# Patient Record
Sex: Female | Born: 1970 | Race: Black or African American | Hispanic: No | Marital: Single | Smoking: Never smoker
Health system: Southern US, Community
[De-identification: ages and names within clinical notes are randomized; demographics above are authoritative.]

## PROBLEM LIST (undated history)

## (undated) DIAGNOSIS — E079 Disorder of thyroid, unspecified: Secondary | ICD-10-CM

## (undated) DIAGNOSIS — R51 Headache: Secondary | ICD-10-CM

## (undated) DIAGNOSIS — E0500 Thyrotoxicosis with diffuse goiter without thyrotoxic crisis or storm: Secondary | ICD-10-CM

## (undated) DIAGNOSIS — I10 Essential (primary) hypertension: Secondary | ICD-10-CM

## (undated) HISTORY — DX: Thyrotoxicosis with diffuse goiter without thyrotoxic crisis or storm: E05.00

## (undated) HISTORY — DX: Disorder of thyroid, unspecified: E07.9

## (undated) HISTORY — DX: Essential (primary) hypertension: I10

## (undated) HISTORY — PX: NO PAST SURGERIES: SHX2092

---

## 2000-02-21 ENCOUNTER — Other Ambulatory Visit: Admission: RE | Admit: 2000-02-21 | Discharge: 2000-02-21 | Payer: Self-pay | Admitting: Obstetrics & Gynecology

## 2001-08-04 ENCOUNTER — Emergency Department (HOSPITAL_COMMUNITY): Admission: EM | Admit: 2001-08-04 | Discharge: 2001-08-05 | Payer: Self-pay | Admitting: Emergency Medicine

## 2001-08-04 ENCOUNTER — Encounter: Payer: Self-pay | Admitting: Emergency Medicine

## 2005-12-16 ENCOUNTER — Other Ambulatory Visit: Admission: RE | Admit: 2005-12-16 | Discharge: 2005-12-16 | Payer: Self-pay | Admitting: Family Medicine

## 2008-11-07 ENCOUNTER — Ambulatory Visit (HOSPITAL_COMMUNITY): Admission: RE | Admit: 2008-11-07 | Discharge: 2008-11-07 | Payer: Self-pay | Admitting: Obstetrics and Gynecology

## 2008-12-04 ENCOUNTER — Encounter (HOSPITAL_COMMUNITY): Admission: RE | Admit: 2008-12-04 | Discharge: 2008-12-12 | Payer: Self-pay | Admitting: Endocrinology

## 2008-12-13 ENCOUNTER — Ambulatory Visit (HOSPITAL_COMMUNITY): Admission: RE | Admit: 2008-12-13 | Discharge: 2008-12-13 | Payer: Self-pay | Admitting: Endocrinology

## 2010-01-10 ENCOUNTER — Encounter: Admission: RE | Admit: 2010-01-10 | Discharge: 2010-04-10 | Payer: Self-pay | Admitting: Obstetrics and Gynecology

## 2011-09-19 LAB — HCG, SERUM, QUALITATIVE: Preg, Serum: NEGATIVE

## 2011-11-25 ENCOUNTER — Ambulatory Visit (INDEPENDENT_AMBULATORY_CARE_PROVIDER_SITE_OTHER): Payer: BC Managed Care – PPO

## 2011-11-25 DIAGNOSIS — R209 Unspecified disturbances of skin sensation: Secondary | ICD-10-CM

## 2011-11-25 DIAGNOSIS — L509 Urticaria, unspecified: Secondary | ICD-10-CM

## 2012-09-21 ENCOUNTER — Other Ambulatory Visit: Payer: Self-pay | Admitting: Radiology

## 2012-09-21 ENCOUNTER — Other Ambulatory Visit: Payer: Self-pay | Admitting: Physician Assistant

## 2012-09-21 MED ORDER — HYDROCHLOROTHIAZIDE 25 MG PO TABS: 25.0000 mg | ORAL_TABLET | Freq: Every day | ORAL | Status: DC

## 2012-12-20 ENCOUNTER — Other Ambulatory Visit: Payer: Self-pay | Admitting: Physician Assistant

## 2012-12-20 NOTE — Telephone Encounter (Signed)
Needs OV - 2nd notice 

## 2012-12-30 ENCOUNTER — Encounter: Payer: Self-pay | Admitting: Family Medicine

## 2012-12-30 ENCOUNTER — Other Ambulatory Visit: Payer: Self-pay | Admitting: Family Medicine

## 2012-12-30 ENCOUNTER — Ambulatory Visit (INDEPENDENT_AMBULATORY_CARE_PROVIDER_SITE_OTHER): Payer: BC Managed Care – PPO | Admitting: Family Medicine

## 2012-12-30 VITALS — BP 139/77 | HR 60 | Temp 98.1°F | Resp 16 | Ht 65.0 in | Wt 228.0 lb

## 2012-12-30 DIAGNOSIS — Z800 Family history of malignant neoplasm of digestive organs: Secondary | ICD-10-CM

## 2012-12-30 DIAGNOSIS — Z23 Encounter for immunization: Secondary | ICD-10-CM

## 2012-12-30 DIAGNOSIS — D219 Benign neoplasm of connective and other soft tissue, unspecified: Secondary | ICD-10-CM

## 2012-12-30 DIAGNOSIS — I10 Essential (primary) hypertension: Secondary | ICD-10-CM

## 2012-12-30 DIAGNOSIS — G4730 Sleep apnea, unspecified: Secondary | ICD-10-CM

## 2012-12-30 DIAGNOSIS — Z0000 Encounter for general adult medical examination without abnormal findings: Secondary | ICD-10-CM

## 2012-12-30 DIAGNOSIS — Z8639 Personal history of other endocrine, nutritional and metabolic disease: Secondary | ICD-10-CM

## 2012-12-30 DIAGNOSIS — D259 Leiomyoma of uterus, unspecified: Secondary | ICD-10-CM

## 2012-12-30 LAB — POCT URINALYSIS DIPSTICK
Bilirubin, UA: NEGATIVE
Blood, UA: NEGATIVE
Glucose, UA: NEGATIVE
Ketones, UA: NEGATIVE
Leukocytes, UA: NEGATIVE
Nitrite, UA: NEGATIVE
Protein, UA: NEGATIVE
Spec Grav, UA: 1.015
Urobilinogen, UA: 0.2
pH, UA: 7

## 2012-12-30 LAB — COMPREHENSIVE METABOLIC PANEL
ALT: 12 U/L (ref 0–35)
AST: 23 U/L (ref 0–37)
Albumin: 4.1 g/dL (ref 3.5–5.2)
Alkaline Phosphatase: 65 U/L (ref 39–117)
BUN: 16 mg/dL (ref 6–23)
CO2: 27 mEq/L (ref 19–32)
Calcium: 9.4 mg/dL (ref 8.4–10.5)
Chloride: 100 mEq/L (ref 96–112)
Creat: 0.64 mg/dL (ref 0.50–1.10)
Glucose, Bld: 85 mg/dL (ref 70–99)
Potassium: 3.9 mEq/L (ref 3.5–5.3)
Sodium: 137 mEq/L (ref 135–145)
Total Bilirubin: 0.7 mg/dL (ref 0.3–1.2)
Total Protein: 6.9 g/dL (ref 6.0–8.3)

## 2012-12-30 LAB — CBC
HCT: 35.5 % — ABNORMAL LOW (ref 36.0–46.0)
Hemoglobin: 11.4 g/dL — ABNORMAL LOW (ref 12.0–15.0)
MCH: 23.5 pg — ABNORMAL LOW (ref 26.0–34.0)
MCHC: 32.1 g/dL (ref 30.0–36.0)
MCV: 73.2 fL — ABNORMAL LOW (ref 78.0–100.0)
Platelets: 330 10*3/uL (ref 150–400)
RBC: 4.85 MIL/uL (ref 3.87–5.11)
RDW: 18 % — ABNORMAL HIGH (ref 11.5–15.5)
WBC: 4.4 10*3/uL (ref 4.0–10.5)

## 2012-12-30 LAB — LIPID PANEL
Cholesterol: 190 mg/dL (ref 0–200)
HDL: 55 mg/dL (ref >39)
LDL Cholesterol: 122 mg/dL — ABNORMAL HIGH (ref 0–99)
Total CHOL/HDL Ratio: 3.5 Ratio
Triglycerides: 63 mg/dL (ref <150)
VLDL: 13 mg/dL (ref 0–40)

## 2012-12-30 LAB — TSH: TSH: 3.609 u[IU]/mL (ref 0.350–4.500)

## 2012-12-30 LAB — T4, FREE: Free T4: 1.32 ng/dL (ref 0.80–1.80)

## 2012-12-30 MED ORDER — HYDROCHLOROTHIAZIDE 25 MG PO TABS: 12.5000 mg | ORAL_TABLET | Freq: Every day | ORAL | Status: DC

## 2012-12-30 MED ORDER — LEVOTHYROXINE SODIUM 25 MCG PO TABS: 25.0000 ug | ORAL_TABLET | Freq: Every day | ORAL | Status: DC

## 2012-12-30 NOTE — Patient Instructions (Addendum)

## 2012-12-30 NOTE — Progress Notes (Signed)
  Subjective:    Patient ID: Heidi Soto, female    DOB: Dec 12, 1971, 42 y.o.   MRN: [id number]  HPI is a 42 year old file and asked who comes in for complete physical. The 2 concerns she has R. or her family history of colon cancer and sleep disturbance  Her father had colon cancer at an early age. Patient is having no constipation or blood per rectum. Patient has been observed to have periods of snoring and interruption of breathing during sleep observed by a friend. She has some fatigue. Patient also notes that she has had a problem with uterine fibroids: She can feel them in her abdomen, she has some painful periods, and she has some extra heavy bleeding at times. Her gynecologist is Dr. Adalberto Ill and she will be making an appointment for her annual exam 7    Review of Systems  Constitutional: Negative.   HENT: Negative.   Eyes: Negative.   Respiratory: Negative.   Cardiovascular: Negative.   Gastrointestinal: Positive for constipation, abdominal distention and rectal pain.  Genitourinary: Negative.   Musculoskeletal: Positive for arthralgias and gait problem.  Skin: Negative.   Neurological: Positive for light-headedness.  Hematological: Negative.   Psychiatric/Behavioral: Positive for sleep disturbance.       Objective:   Physical Exam no acute distress-see vital signs section for vital signs HEENT: Unremarkable Neck: Supple no adenopathy Heart regular no murmur Chest: Clear Abdomen: Palpable large uterine fibroids and the entire lower abdomen up to the umbilicus. Extremities: Full range of motion Skin: No rashes or unusual lesions      Assessment & Plan:   1. Annual physical exam  CBC, Comprehensive metabolic panel, Lipid panel, POCT urinalysis dipstick  2. H/O Graves' disease  TSH, T4, Free, levothyroxine (SYNTHROID, LEVOTHROID) 25 MCG tablet  3. Fibroids    4. Sleep apnea  Ambulatory referral to Sleep Studies  5. FH: colon cancer  Ambulatory referral to  Gastroenterology  6. Hypertension  hydrochlorothiazide (HYDRODIURIL) 25 MG tablet  7. Need for prophylactic vaccination and inoculation against influenza  Flu vaccine greater than or equal to 3yo preservative free IM

## 2013-03-02 ENCOUNTER — Encounter: Payer: Self-pay | Admitting: Family Medicine

## 2013-03-02 ENCOUNTER — Ambulatory Visit (INDEPENDENT_AMBULATORY_CARE_PROVIDER_SITE_OTHER): Payer: BC Managed Care – PPO | Admitting: Family Medicine

## 2013-03-02 VITALS — BP 146/93 | HR 87 | Temp 98.3°F | Resp 16 | Ht 65.0 in | Wt 245.0 lb

## 2013-03-02 DIAGNOSIS — E039 Hypothyroidism, unspecified: Secondary | ICD-10-CM

## 2013-03-02 DIAGNOSIS — D509 Iron deficiency anemia, unspecified: Secondary | ICD-10-CM

## 2013-03-02 DIAGNOSIS — D259 Leiomyoma of uterus, unspecified: Secondary | ICD-10-CM

## 2013-03-02 LAB — CBC
HCT: 36.6 % (ref 36.0–46.0)
Hemoglobin: 11.8 g/dL — ABNORMAL LOW (ref 12.0–15.0)
MCH: 24 pg — ABNORMAL LOW (ref 26.0–34.0)
MCHC: 32.2 g/dL (ref 30.0–36.0)
MCV: 74.4 fL — ABNORMAL LOW (ref 78.0–100.0)
Platelets: 295 10*3/uL (ref 150–400)
RBC: 4.92 MIL/uL (ref 3.87–5.11)
RDW: 19.4 % — ABNORMAL HIGH (ref 11.5–15.5)
WBC: 7.1 10*3/uL (ref 4.0–10.5)

## 2013-03-02 LAB — TSH: TSH: 16.08 u[IU]/mL — ABNORMAL HIGH (ref 0.350–4.500)

## 2013-03-02 LAB — FERRITIN: Ferritin: 5 ng/mL — ABNORMAL LOW (ref 10–291)

## 2013-03-02 NOTE — Progress Notes (Signed)
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  She has constipation and eructation.  She is having colonoscopy next week. She sees Dr. Adalberto Ill. She was taking BCP throughout the month of February but she bled the entire month. Gaining weight and exercise is uncomfortable.  Objective:  NAD Abdomen:  20 week size uterus with irreg lower abdominal mass  Assessment:  Marked fibroids with GI side effects, bleeding and anemia.  I believe this woman needs a procedure to reduce the fibroids, and the question is whether hysterectomy or myomectomy after Lupron is more appropriated.  Plan:  Second Gyn opinion Uterine fibroid - Plan: TSH, CBC, Ferritin, Ambulatory referral to Gynecology  Unspecified hypothyroidism - Plan: TSH

## 2013-03-03 ENCOUNTER — Other Ambulatory Visit: Payer: Self-pay | Admitting: Family Medicine

## 2013-03-03 DIAGNOSIS — E039 Hypothyroidism, unspecified: Secondary | ICD-10-CM

## 2013-03-03 MED ORDER — LEVOTHYROXINE SODIUM 88 MCG PO TABS: 88.0000 ug | ORAL_TABLET | Freq: Every day | ORAL | Status: DC

## 2013-06-20 ENCOUNTER — Other Ambulatory Visit: Payer: Self-pay | Admitting: Obstetrics & Gynecology

## 2013-06-21 ENCOUNTER — Encounter (HOSPITAL_COMMUNITY): Payer: Self-pay | Admitting: Pharmacy Technician

## 2013-06-23 ENCOUNTER — Other Ambulatory Visit: Payer: Self-pay

## 2013-06-23 ENCOUNTER — Encounter (HOSPITAL_COMMUNITY)
Admission: RE | Admit: 2013-06-23 | Discharge: 2013-06-23 | Disposition: A | Discharge status: Home / Self Care | Payer: BC Managed Care – PPO | Source: Ambulatory Visit | Attending: Obstetrics & Gynecology | Admitting: Obstetrics & Gynecology

## 2013-06-23 ENCOUNTER — Encounter (HOSPITAL_COMMUNITY): Payer: Self-pay

## 2013-06-23 DIAGNOSIS — Z01818 Encounter for other preprocedural examination: Secondary | ICD-10-CM | POA: Insufficient documentation

## 2013-06-23 DIAGNOSIS — Z01812 Encounter for preprocedural laboratory examination: Secondary | ICD-10-CM | POA: Insufficient documentation

## 2013-06-23 HISTORY — DX: Headache: R51

## 2013-06-23 LAB — CBC
Platelets: 262 10*3/uL (ref 150–400)
RDW: 16.9 % — ABNORMAL HIGH (ref 11.5–15.5)
WBC: 6 10*3/uL (ref 4.0–10.5)

## 2013-06-23 LAB — BASIC METABOLIC PANEL
Chloride: 98 mEq/L (ref 96–112)
GFR calc Af Amer: >90 mL/min (ref >90)
GFR calc non Af Amer: >90 mL/min (ref >90)
Potassium: 3.3 mEq/L — ABNORMAL LOW (ref 3.5–5.1)
Sodium: 136 mEq/L (ref 135–145)

## 2013-06-23 NOTE — Patient Instructions (Signed)
Your procedure is scheduled on:06/29/13  Enter through the Main Entrance at :1130 am Pick up desk phone and dial [phone] and inform us of your arrival.  Please call [phone] if you have any problems the morning of surgery.  Remember: Do not eat food after midnight: Tuesday Clear liquids are ok until:9am on Wed   You may brush your teeth the morning of surgery.  Take these meds the morning of surgery with a sip of water:Thyroid pill and BP pill  DO NOT wear jewelry, eye make-up, lipstick,body lotion, or dark fingernail polish.  (Polished toes are ok) You may wear deodorant.  If you are to be admitted after surgery, leave suitcase in car until your room has been assigned. Patients discharged on the day of surgery will not be allowed to drive home. Wear loose fitting, comfortable clothes for your ride home.

## 2013-06-27 NOTE — Pre-Procedure Instructions (Signed)
EKG rec'd from [facility]. Reviewed by Dr Arby Barrette. Ok'd for surgery, no orders given.

## 2013-06-29 ENCOUNTER — Encounter (HOSPITAL_COMMUNITY): Payer: Self-pay | Admitting: Anesthesiology

## 2013-06-29 ENCOUNTER — Encounter (HOSPITAL_COMMUNITY)
Admission: RE | Disposition: A | Discharge status: Home / Self Care | Payer: Self-pay | Source: Ambulatory Visit | Attending: Obstetrics & Gynecology

## 2013-06-29 ENCOUNTER — Inpatient Hospital Stay (HOSPITAL_COMMUNITY)
Admission: RE | Admit: 2013-06-29 | Discharge: 2013-07-01 | DRG: 359 | Disposition: A | Discharge status: Home / Self Care | Payer: BC Managed Care – PPO | Source: Ambulatory Visit | Attending: Obstetrics & Gynecology | Admitting: Obstetrics & Gynecology

## 2013-06-29 ENCOUNTER — Encounter (HOSPITAL_COMMUNITY): Payer: Self-pay | Admitting: *Deleted

## 2013-06-29 ENCOUNTER — Inpatient Hospital Stay (HOSPITAL_COMMUNITY): Payer: BC Managed Care – PPO | Admitting: Anesthesiology

## 2013-06-29 DIAGNOSIS — Z90710 Acquired absence of both cervix and uterus: Secondary | ICD-10-CM

## 2013-06-29 DIAGNOSIS — E079 Disorder of thyroid, unspecified: Secondary | ICD-10-CM | POA: Diagnosis present

## 2013-06-29 DIAGNOSIS — N72 Inflammatory disease of cervix uteri: Secondary | ICD-10-CM | POA: Diagnosis present

## 2013-06-29 DIAGNOSIS — D649 Anemia, unspecified: Secondary | ICD-10-CM | POA: Diagnosis present

## 2013-06-29 DIAGNOSIS — N8000 Endometriosis of the uterus, unspecified: Secondary | ICD-10-CM | POA: Diagnosis present

## 2013-06-29 DIAGNOSIS — D219 Benign neoplasm of connective and other soft tissue, unspecified: Secondary | ICD-10-CM

## 2013-06-29 DIAGNOSIS — I10 Essential (primary) hypertension: Secondary | ICD-10-CM | POA: Diagnosis present

## 2013-06-29 DIAGNOSIS — D250 Submucous leiomyoma of uterus: Principal | ICD-10-CM | POA: Diagnosis present

## 2013-06-29 DIAGNOSIS — Z8639 Personal history of other endocrine, nutritional and metabolic disease: Secondary | ICD-10-CM

## 2013-06-29 DIAGNOSIS — D251 Intramural leiomyoma of uterus: Secondary | ICD-10-CM | POA: Diagnosis present

## 2013-06-29 DIAGNOSIS — E0500 Thyrotoxicosis with diffuse goiter without thyrotoxic crisis or storm: Secondary | ICD-10-CM | POA: Diagnosis present

## 2013-06-29 DIAGNOSIS — N920 Excessive and frequent menstruation with regular cycle: Secondary | ICD-10-CM | POA: Diagnosis present

## 2013-06-29 DIAGNOSIS — D252 Subserosal leiomyoma of uterus: Secondary | ICD-10-CM | POA: Diagnosis present

## 2013-06-29 DIAGNOSIS — N840 Polyp of corpus uteri: Secondary | ICD-10-CM | POA: Diagnosis present

## 2013-06-29 HISTORY — PX: BILATERAL SALPINGECTOMY: SHX5743

## 2013-06-29 HISTORY — PX: ABDOMINAL HYSTERECTOMY: SHX81

## 2013-06-29 LAB — BASIC METABOLIC PANEL
BUN: 10 mg/dL (ref 6–23)
CO2: 27 mEq/L (ref 19–32)
Calcium: 9.8 mg/dL (ref 8.4–10.5)
GFR calc Af Amer: >90 mL/min (ref >90)
GFR calc non Af Amer: >90 mL/min (ref >90)
GFR calc non Af Amer: >90 mL/min (ref >90)
Glucose, Bld: 136 mg/dL — ABNORMAL HIGH (ref 70–99)
Potassium: 3.5 mEq/L (ref 3.5–5.1)
Sodium: 139 mEq/L (ref 135–145)

## 2013-06-29 LAB — CBC
HCT: 37.5 % (ref 36.0–46.0)
Hemoglobin: 12.5 g/dL (ref 12.0–15.0)
MCHC: 33.3 g/dL (ref 30.0–36.0)

## 2013-06-29 LAB — TYPE AND SCREEN: ABO/RH(D): O POS

## 2013-06-29 LAB — PREGNANCY, URINE: Preg Test, Ur: NEGATIVE

## 2013-06-29 SURGERY — HYSTERECTOMY, ABDOMINAL
Anesthesia: General | Site: Abdomen | Wound class: Clean Contaminated

## 2013-06-29 MED ORDER — EPHEDRINE 5 MG/ML INJ
INTRAVENOUS | Status: AC
  Filled 2013-06-29: qty 10

## 2013-06-29 MED ORDER — MORPHINE SULFATE (PF) 1 MG/ML IV SOLN
INTRAVENOUS | Status: AC
  Administered 2013-06-29: 21 mg via INTRAVENOUS
  Administered 2013-06-29 (×2): via INTRAVENOUS
  Administered 2013-06-30: 14 mg via INTRAVENOUS
  Administered 2013-06-30: 15 mg via INTRAVENOUS
  Administered 2013-06-30: 07:00:00 via INTRAVENOUS
  Filled 2013-06-29 (×3): qty 25

## 2013-06-29 MED ORDER — ONDANSETRON HCL 4 MG/2ML IJ SOLN
INTRAMUSCULAR | Status: DC | PRN
  Administered 2013-06-29: 4 mg via INTRAVENOUS

## 2013-06-29 MED ORDER — LACTATED RINGERS IV SOLN
INTRAVENOUS | Status: DC
  Administered 2013-06-29 (×5): via INTRAVENOUS

## 2013-06-29 MED ORDER — KETOROLAC TROMETHAMINE 30 MG/ML IJ SOLN: 30.0000 mg | Freq: Three times a day (TID) | INTRAMUSCULAR | Status: DC

## 2013-06-29 MED ORDER — BUPIVACAINE HCL (PF) 0.25 % IJ SOLN
INTRAMUSCULAR | Status: DC | PRN
  Administered 2013-06-29: 10 mL

## 2013-06-29 MED ORDER — GLYCOPYRROLATE 0.2 MG/ML IJ SOLN
INTRAMUSCULAR | Status: DC | PRN
  Administered 2013-06-29: 0.6 mg via INTRAVENOUS
  Administered 2013-06-29 (×2): 0.1 mg via INTRAVENOUS

## 2013-06-29 MED ORDER — ACETAMINOPHEN 10 MG/ML IV SOLN
INTRAVENOUS | Status: AC
  Administered 2013-06-29: 1000 mg via INTRAVENOUS
  Filled 2013-06-29: qty 100

## 2013-06-29 MED ORDER — HYDROMORPHONE HCL PF 1 MG/ML IJ SOLN
INTRAMUSCULAR | Status: AC
  Administered 2013-06-29: 0.5 mg via INTRAVENOUS
  Filled 2013-06-29: qty 1

## 2013-06-29 MED ORDER — KETOROLAC TROMETHAMINE 30 MG/ML IJ SOLN: 15.0000 mg | Freq: Once | INTRAMUSCULAR | Status: DC | PRN

## 2013-06-29 MED ORDER — FENTANYL CITRATE 0.05 MG/ML IJ SOLN
INTRAMUSCULAR | Status: DC | PRN
  Administered 2013-06-29: 50 ug via INTRAVENOUS
  Administered 2013-06-29 (×2): 100 ug via INTRAVENOUS
  Administered 2013-06-29 (×2): 50 ug via INTRAVENOUS

## 2013-06-29 MED ORDER — ROCURONIUM BROMIDE 50 MG/5ML IV SOLN
INTRAVENOUS | Status: AC
  Filled 2013-06-29: qty 1

## 2013-06-29 MED ORDER — MIDAZOLAM HCL 2 MG/2ML IJ SOLN
INTRAMUSCULAR | Status: AC
  Filled 2013-06-29: qty 2

## 2013-06-29 MED ORDER — HYDROCHLOROTHIAZIDE 25 MG PO TABS
12.5000 mg | ORAL_TABLET | Freq: Every day | ORAL | Status: DC
  Filled 2013-06-29: qty 0.5

## 2013-06-29 MED ORDER — FENTANYL CITRATE 0.05 MG/ML IJ SOLN
INTRAMUSCULAR | Status: AC
  Filled 2013-06-29: qty 10

## 2013-06-29 MED ORDER — DIPHENHYDRAMINE HCL 12.5 MG/5ML PO ELIX
12.5000 mg | ORAL_SOLUTION | Freq: Four times a day (QID) | ORAL | Status: DC | PRN
  Administered 2013-06-30: 12.5 mg via ORAL
  Filled 2013-06-29: qty 5

## 2013-06-29 MED ORDER — SODIUM CHLORIDE 0.9 % IJ SOLN: 9.0000 mL | INTRAMUSCULAR | Status: DC | PRN

## 2013-06-29 MED ORDER — PROPOFOL 10 MG/ML IV EMUL
INTRAVENOUS | Status: AC
  Filled 2013-06-29: qty 20

## 2013-06-29 MED ORDER — DEXAMETHASONE SODIUM PHOSPHATE 10 MG/ML IJ SOLN
INTRAMUSCULAR | Status: AC
  Filled 2013-06-29: qty 1

## 2013-06-29 MED ORDER — ONDANSETRON HCL 4 MG/2ML IJ SOLN
INTRAMUSCULAR | Status: AC
  Filled 2013-06-29: qty 2

## 2013-06-29 MED ORDER — MENTHOL 3 MG MT LOZG: 1.0000 | LOZENGE | OROMUCOSAL | Status: DC | PRN

## 2013-06-29 MED ORDER — HYDROCHLOROTHIAZIDE 12.5 MG PO CAPS
12.5000 mg | ORAL_CAPSULE | Freq: Every day | ORAL | Status: DC
  Administered 2013-06-30 – 2013-07-01 (×2): 12.5 mg via ORAL
  Filled 2013-06-29 (×3): qty 1

## 2013-06-29 MED ORDER — CEFAZOLIN SODIUM-DEXTROSE 2-3 GM-% IV SOLR
2.0000 g | INTRAVENOUS | Status: AC
  Administered 2013-06-29: 2 g via INTRAVENOUS

## 2013-06-29 MED ORDER — GLYCOPYRROLATE 0.2 MG/ML IJ SOLN
INTRAMUSCULAR | Status: AC
  Filled 2013-06-29: qty 3

## 2013-06-29 MED ORDER — ONDANSETRON HCL 4 MG/2ML IJ SOLN
4.0000 mg | Freq: Four times a day (QID) | INTRAMUSCULAR | Status: DC | PRN
  Administered 2013-06-30: 4 mg via INTRAVENOUS
  Filled 2013-06-29 (×2): qty 2

## 2013-06-29 MED ORDER — PHENYLEPHRINE HCL 10 MG/ML IJ SOLN
INTRAMUSCULAR | Status: DC | PRN
  Administered 2013-06-29 (×2): 40 ug via INTRAVENOUS

## 2013-06-29 MED ORDER — LEVOTHYROXINE SODIUM 88 MCG PO TABS
88.0000 ug | ORAL_TABLET | ORAL | Status: DC
  Administered 2013-06-30 – 2013-07-01 (×2): 88 ug via ORAL
  Filled 2013-06-29 (×3): qty 1

## 2013-06-29 MED ORDER — 0.9 % SODIUM CHLORIDE (POUR BTL) OPTIME
TOPICAL | Status: DC | PRN
  Administered 2013-06-29 (×2): 1000 mL

## 2013-06-29 MED ORDER — ONDANSETRON HCL 4 MG/2ML IJ SOLN
4.0000 mg | Freq: Four times a day (QID) | INTRAMUSCULAR | Status: DC | PRN
  Administered 2013-06-29: 4 mg via INTRAVENOUS

## 2013-06-29 MED ORDER — PROPOFOL 10 MG/ML IV BOLUS
INTRAVENOUS | Status: DC | PRN
  Administered 2013-06-29: 200 mg via INTRAVENOUS

## 2013-06-29 MED ORDER — ONDANSETRON HCL 4 MG PO TABS: 4.0000 mg | ORAL_TABLET | Freq: Four times a day (QID) | ORAL | Status: DC | PRN

## 2013-06-29 MED ORDER — NALOXONE HCL 0.4 MG/ML IJ SOLN: 0.4000 mg | INTRAMUSCULAR | Status: DC | PRN

## 2013-06-29 MED ORDER — OXYCODONE-ACETAMINOPHEN 5-325 MG PO TABS
1.0000 | ORAL_TABLET | ORAL | Status: DC | PRN
  Administered 2013-06-30 – 2013-07-01 (×7): 2 via ORAL
  Filled 2013-06-29 (×7): qty 2

## 2013-06-29 MED ORDER — DIPHENHYDRAMINE HCL 50 MG/ML IJ SOLN: 12.5000 mg | Freq: Four times a day (QID) | INTRAMUSCULAR | Status: DC | PRN

## 2013-06-29 MED ORDER — KETOROLAC TROMETHAMINE 30 MG/ML IJ SOLN
INTRAMUSCULAR | Status: DC | PRN
  Administered 2013-06-29: 30 mg via INTRAVENOUS

## 2013-06-29 MED ORDER — LACTATED RINGERS IV SOLN: INTRAVENOUS | Status: DC

## 2013-06-29 MED ORDER — EPHEDRINE SULFATE 50 MG/ML IJ SOLN
INTRAMUSCULAR | Status: DC | PRN
  Administered 2013-06-29 (×2): 5 mg via INTRAVENOUS

## 2013-06-29 MED ORDER — POTASSIUM CHLORIDE 2 MEQ/ML IV SOLN
INTRAVENOUS | Status: DC
  Administered 2013-06-29 – 2013-06-30 (×2): via INTRAVENOUS
  Filled 2013-06-29 (×8): qty 1000

## 2013-06-29 MED ORDER — NEOSTIGMINE METHYLSULFATE 1 MG/ML IJ SOLN
INTRAMUSCULAR | Status: DC | PRN
  Administered 2013-06-29: 3 mg via INTRAVENOUS

## 2013-06-29 MED ORDER — HYDROMORPHONE HCL PF 1 MG/ML IJ SOLN
0.2500 mg | INTRAMUSCULAR | Status: DC | PRN
  Administered 2013-06-29 (×2): 0.5 mg via INTRAVENOUS

## 2013-06-29 MED ORDER — MIDAZOLAM HCL 5 MG/5ML IJ SOLN
INTRAMUSCULAR | Status: DC | PRN
  Administered 2013-06-29: 2 mg via INTRAVENOUS

## 2013-06-29 MED ORDER — DEXAMETHASONE SODIUM PHOSPHATE 4 MG/ML IJ SOLN
INTRAMUSCULAR | Status: DC | PRN
  Administered 2013-06-29: 8 mg via INTRAVENOUS

## 2013-06-29 MED ORDER — ROCURONIUM BROMIDE 100 MG/10ML IV SOLN
INTRAVENOUS | Status: DC | PRN
  Administered 2013-06-29 (×3): 10 mg via INTRAVENOUS
  Administered 2013-06-29: 40 mg via INTRAVENOUS
  Administered 2013-06-29 (×2): 5 mg via INTRAVENOUS

## 2013-06-29 MED ORDER — PROMETHAZINE HCL 25 MG/ML IJ SOLN: 6.2500 mg | INTRAMUSCULAR | Status: DC | PRN

## 2013-06-29 MED ORDER — MEPERIDINE HCL 25 MG/ML IJ SOLN: 6.2500 mg | INTRAMUSCULAR | Status: DC | PRN

## 2013-06-29 MED ORDER — NEOSTIGMINE METHYLSULFATE 1 MG/ML IJ SOLN
INTRAMUSCULAR | Status: AC
  Filled 2013-06-29: qty 1

## 2013-06-29 MED ORDER — LIDOCAINE HCL (CARDIAC) 20 MG/ML IV SOLN
INTRAVENOUS | Status: DC | PRN
  Administered 2013-06-29: 50 mg via INTRAVENOUS
  Administered 2013-06-29: 20 mg via INTRAVENOUS

## 2013-06-29 MED ORDER — CEFAZOLIN SODIUM-DEXTROSE 2-3 GM-% IV SOLR
INTRAVENOUS | Status: AC
  Filled 2013-06-29: qty 50

## 2013-06-29 MED ORDER — FENTANYL CITRATE 0.05 MG/ML IJ SOLN
INTRAMUSCULAR | Status: AC
  Filled 2013-06-29: qty 5

## 2013-06-29 MED ORDER — ACETAMINOPHEN 10 MG/ML IV SOLN: 1000.0000 mg | Freq: Once | INTRAVENOUS | Status: DC

## 2013-06-29 MED ORDER — BUPIVACAINE HCL (PF) 0.25 % IJ SOLN
INTRAMUSCULAR | Status: AC
  Filled 2013-06-29: qty 30

## 2013-06-29 SURGICAL SUPPLY — 27 items
CANISTER SUCTION 2500CC (MISCELLANEOUS) ×3 IMPLANT
CHLORAPREP W/TINT 26ML (MISCELLANEOUS) ×1 IMPLANT
CLOTH BEACON ORANGE TIMEOUT ST (SAFETY) ×3 IMPLANT
CONT PATH 16OZ SNAP LID 3702 (MISCELLANEOUS) ×3 IMPLANT
DRSG OPSITE POSTOP 4X10 (GAUZE/BANDAGES/DRESSINGS) ×2 IMPLANT
GAUZE SPONGE 4X4 16PLY XRAY LF (GAUZE/BANDAGES/DRESSINGS) ×3 IMPLANT
GLOVE BIO SURGEON STRL SZ7 (GLOVE) ×5 IMPLANT
GLOVE BIOGEL PI IND STRL 7.0 (GLOVE) ×2 IMPLANT
GLOVE BIOGEL PI INDICATOR 7.0 (GLOVE) ×2
GOWN PREVENTION PLUS LG XLONG (DISPOSABLE) ×9 IMPLANT
NS IRRIG 1000ML POUR BTL (IV SOLUTION) ×4 IMPLANT
PACK ABDOMINAL GYN (CUSTOM PROCEDURE TRAY) ×3 IMPLANT
PAD OB MATERNITY 4.3X12.25 (PERSONAL CARE ITEMS) ×3 IMPLANT
PROTECTOR NERVE ULNAR (MISCELLANEOUS) ×3 IMPLANT
SPONGE LAP 18X18 X RAY DECT (DISPOSABLE) ×3 IMPLANT
STAPLER VISISTAT 35W (STAPLE) IMPLANT
SUT PLAIN 2 0 XLH (SUTURE) IMPLANT
SUT PROLENE 0 CT 1 30 (SUTURE) IMPLANT
SUT VIC AB 0 CT1 18XCR BRD8 (SUTURE) ×6 IMPLANT
SUT VIC AB 0 CT1 36 (SUTURE) ×16 IMPLANT
SUT VIC AB 0 CT1 8-18 (SUTURE) ×9
SUT VIC AB 4-0 KS 27 (SUTURE) ×3 IMPLANT
SUT VIC AB 4-0 SH 18 (SUTURE) IMPLANT
SUT VICRYL 0 TIES 12 18 (SUTURE) ×3 IMPLANT
TOWEL OR 17X24 6PK STRL BLUE (TOWEL DISPOSABLE) ×6 IMPLANT
TRAY FOLEY CATH 14FR (SET/KITS/TRAYS/PACK) ×3 IMPLANT
WATER STERILE IRR 1000ML POUR (IV SOLUTION) ×3 IMPLANT

## 2013-06-29 NOTE — Anesthesia Preprocedure Evaluation (Signed)
Anesthesia Evaluation  Patient identified by MRN, date of birth, ID band Patient awake    Reviewed: Allergy & Precautions, H&P , NPO status , Patient's Chart, lab work & pertinent test results  Airway Mallampati: I TM Distance: >3 FB Neck ROM: full    Dental no notable dental hx. (+) Teeth Intact   Pulmonary neg pulmonary ROS,    Pulmonary exam normal       Cardiovascular hypertension, Pt. on medications     Neuro/Psych negative psych ROS   GI/Hepatic negative GI ROS, Neg liver ROS,   Endo/Other  Hyperthyroidism Morbid obesity  Renal/GU negative Renal ROS     Musculoskeletal negative musculoskeletal ROS (+)   Abdominal (+) + obese,   Peds negative pediatric ROS (+)  Hematology negative hematology ROS (+)   Anesthesia Other Findings   Reproductive/Obstetrics negative OB ROS                           Anesthesia Physical Anesthesia Plan  ASA: III  Anesthesia Plan: General   Post-op Pain Management:    Induction: Intravenous  Airway Management Planned: Oral ETT  Additional Equipment:   Intra-op Plan:   Post-operative Plan:   Informed Consent: I have reviewed the patients History and Physical, chart, labs and discussed the procedure including the risks, benefits and alternatives for the proposed anesthesia with the patient or authorized representative who has indicated his/her understanding and acceptance.   Dental Advisory Given  Plan Discussed with: CRNA and Surgeon  Anesthesia Plan Comments:         Anesthesia Quick Evaluation

## 2013-06-29 NOTE — Anesthesia Postprocedure Evaluation (Signed)
  Anesthesia Post Note  Patient: Heidi Soto  Procedure(s) Performed: Procedure(s) (LRB): HYSTERECTOMY ABDOMINAL (N/A) BILATERAL SALPINGECTOMY (Bilateral)  Anesthesia type: GA  Patient location: PACU  Post pain: Pain level controlled  Post assessment: Post-op Vital signs reviewed  Last Vitals:  Filed Vitals:   06/29/13 1620  BP:   Pulse:   Temp: 36.6 C  Resp:     Post vital signs: Reviewed  Level of consciousness: sedated  Complications: No apparent anesthesia complications

## 2013-06-29 NOTE — H&P (Signed)
Heidi Soto is an 41 y.o. female G0 with symptomatic uterine fibroids, menorrhagia, anemia, abdominal mass. She desires hysterectomy and declined morcellation of fibroids. She also declined myomectomy as well as uterine fibroid embolization.  She is s/p Depo Lupron inj to reduce fibroids size. Denies any bleeding/menses in 2.1/2 months. Nl Pap history. No breast complaints. Is single at present and declines future childbearing option.  Patient's last menstrual period was 04/13/2013.    Past Medical History  Diagnosis Date  . Thyroid disease   . Graves disease   . Hypertension   . [id number](657.8)     Past Surgical History  Procedure Laterality Date  . No past surgeries      Family History  Problem Relation Age of Onset  . COPD Mother   . Hypertension Mother   . Asthma Mother   . Hypertension Father   . Diabetes Father   . Cancer Father     colon and prostate  . Hypertension Sister   . Diabetes Sister   . Diabetes Brother   . Hypertension Brother   . Heart disease Paternal Grandmother   . Heart disease Paternal Grandfather     Social History:  reports that she has never smoked. She does not have any smokeless tobacco history on file. She reports that she does not drink alcohol or use illicit drugs.  Allergies:  Allergies  Allergen Reactions  . Bactroban (Mupirocin Calcium) Rash    Prescriptions prior to admission  Medication Sig Dispense Refill  . hydrochlorothiazide (HYDRODIURIL) 25 MG tablet Take 0.5 tablets (12.5 mg total) by mouth daily. NEED OFFICE VISIT  90 tablet  3  . levothyroxine (SYNTHROID, LEVOTHROID) 88 MCG tablet Take 1 tablet (88 mcg total) by mouth daily.  90 tablet  3  . Multiple Vitamin (MULTIVITAMIN WITH MINERALS) TABS Take 1 tablet by mouth daily.      . hydrocortisone cream 1 % Apply 1 application topically 2 (two) times daily as needed (skin rash).        Review of Systems  Constitutional: Negative for fever.  Respiratory: Negative for  cough.   Cardiovascular: Negative for chest pain.  Genitourinary: Negative for dysuria.  Musculoskeletal: Negative for myalgias and back pain.  Neurological: Negative for dizziness and headaches.  Psychiatric/Behavioral: Negative for depression.    Blood pressure 135/78, pulse 64, temperature 98.1 F (36.7 C), temperature source Oral, resp. rate 16, height 5' 4.5" (1.638 m), weight 264 lb (119.75 kg), last menstrual period 04/13/2013, SpO2 100.00%. Physical Exam  A&O x 3, no acute distress. Pleasant HEENT neg, no thyromegaly Lungs CTA bilat CV RRR, S1S2 normal Abdo soft, non tender, non acute. Abdominal c/w fibroids 20 wks but reduced in size laterally. Extr no edema/ tenderness Pelvic deferred today.    Results for orders placed during the hospital encounter of 06/29/13 (from the past 24 hour(s))  PREGNANCY, URINE     Status: None   Collection Time    06/29/13 11:15 AM      Result Value Range   Preg Test, Ur NEGATIVE  NEGATIVE  ABO/RH     Status: None   Collection Time    06/29/13 11:40 AM      Result Value Range   ABO/RH(D) O POS    TYPE AND SCREEN     Status: None   Collection Time    06/29/13 11:41 AM      Result Value Range   ABO/RH(D) O POS     Antibody Screen NEG  Sample Expiration 07/02/2013      No results found.  Assessment/Plan: Symptomatic uterine fibroids. Here for abdominal hysterectomy and remove both tubes. Will keep ovaries since young and no obvious ovarian cancer risks. Pt declines laparoscopic hysterectomy after counseling with FDA warning and risk of sarcoma spread. She also declined myomectomy and UFE. She understands she will NOT be abel to bear children since she will not have a uterus after surgery.   Risks/complications of surgery reviewed incl infection, bleeding, damage to internal organs including bladder, bowels, ureters, blood vessels, other risks from anesthesia, VTE and delayed complications of any surgery, complications in future  surgery reviewed.   Zhuri Krass R 06/29/2013, 1:25 PM

## 2013-06-29 NOTE — Op Note (Signed)
06/29/2013   Heidi Soto  42 y.o. female  PRE-OPERATIVE DIAGNOSIS:  Uterine Fibroids  [id number]  POST-OPERATIVE DIAGNOSIS:  Uterine Fibroids ( specimen weight 952 gm)  PROCEDURE:  TOTAL ABDOMINAL HYSTERECTOMY, BILATERAL SALPINGECTOMY  SURGEON: Robley Fries, MD  ASSISTANT:  Serita Kyle, MD   ANESTHESIA:  General endotracheal  EBL: 150 cc   IVF: LR 2800 cc   Urine output: 500 cc clear urine in foley  BLOOD ADMINISTERED:none  DRAINS: Urinary Catheter (Foley)   LOCAL MEDICATIONS USED:  MARCAINE 0.25% 10 cc skin infiltration     SPECIMEN:  Fibroid uterus, cervix, bilateral fallopian tubes (total weight 952 gm).   DISPOSITION OF SPECIMEN:  PATHOLOGY  COUNTS:  YES  PATIENT DISPOSITION:  PACU - hemodynamically stable. Then admit for post-op care.   Delay start of Pharmacological VTE agent (>24hrs) due to surgical blood loss or risk of bleeding: yes  PROCEDURE:   Indication: 42 yo, G0 with symptomatic uterine fibroids with abdominal mass, pain and menorrhagia. Patient declined laparoscopic surgery due to concern about uterine morcellation and chose to have abdominal hysterectomy. Salpingectomy data on ovarian cancer risk reduction was reviewed and she agreed. Risks and complications of surgery including infection, bleeding, damage to internal organs and other including but not limited to surgery related problems including pneumonia, VTE reviewed. Informed written consent was obtained.   Patient was brought to the operating room with IV running. She received 2 gm Ancef. Underwent general anesthesia without difficulty and was given dorsal supine position, prepped and draped in sterile fashion. Foley catheter was placed. Exam under anesthesia noted uterus at the umbilicus but mobile. Pfannenstiel incision was made with scalpel and carried down to the underlying fascia with Bovie with excellent hemostasis.  Fascia incised and extended laterally. Fascia grasped with Kocher's and  underlying rectus muscles were dissected down. Rectus muscles were separated in midline. Posterior rectus sheath and posterior peritoneum was grasped with mosquitoes and peritoneal entry made. Large fibroid uterus was palpated. No adhesions noted. Upper abdomen felt normal. Uterus was delivered out of the incision with manipulation. Fallopian tubes and both ovaries appear normal. Bilateral round ligaments were grasped, incised and suture ligated with 0 Vicryl and mosquito clamps placed at the ends. Anterior broad ligament was dissected and cut to creat bladder flap. Bladder was pushed further away with blunt dissection with sponge stick. A window was created in posterior broad ligament and right fallopian tube and right utero-ovarian ligament was clamped with Heaney clamps x 2 and cut in between. Hemostasis excellent. Please note that right salpingectomy was performed after hysterectomy. Left broad ligament was dissected and left salpingectomy was performed. Left utero-ovarian ligament was double clamped with Heaney clamp and cut and transfixed with 0 Vicryl. Posterior broad ligament was dissected bilaterally and uterine vessels were skeletonized. Bilateral uterine vessels were clamped with Heaney clamps and cut and transfixed with 0 Vicryl. Uterus was amputated above the cervix and passed off and cervical stump was grasped with tenaculum. Hemostasis was observed. Straight Heaney clamps applied bilaterally, Cardinal ligaments cut and transfixed with 0 Vicryl. Bilateral curved Heaney's applied to vaginal angles with Uterosacral ligament and cut and transfixed. Vaginal opening noted, was cut circumferentially. Vaginal cut was closed from one angle to the other with 0 Vicryl interlocking sutures with excellent hemostasis. Right salpingectomy was performed. All pedicles appeared dry. Ovaries looked hemostatic and normal. Suction irrigation done. Peritoneal edges grasped and peritoneum closed with 2-0 Vicryl. Fascia  sutured with 0 Vicryl from two ends and  met in midline. Subcutaneous layer was deep and closed with 2-0 Plain gut. Skin approximated with 4-0 Vicryl in subcuticular fashion.  Sterile dressing placed. One area on left superior aspect of incision noted to have skin abrasion, was covered with steristrip. Sterile Honeycomb dressing placed.  All instruments/lap/sponges counts were correct x2. No complications.  Dr Juliene Pina was the surgeon for entire case.

## 2013-06-29 NOTE — Transfer of Care (Signed)
Immediate Anesthesia Transfer of Care Note  Patient: Heidi Soto  Procedure(s) Performed: Procedure(s): HYSTERECTOMY ABDOMINAL (N/A) BILATERAL SALPINGECTOMY (Bilateral)  Patient Location: PACU  Anesthesia Type:General  Level of Consciousness: awake, oriented and patient cooperative  Airway & Oxygen Therapy: Patient Spontanous Breathing and Patient connected to nasal cannula oxygen  Post-op Assessment: Report given to PACU RN and Post -op Vital signs reviewed and stable  Post vital signs: Reviewed and stable  Complications: No apparent anesthesia complications

## 2013-06-30 ENCOUNTER — Encounter (HOSPITAL_COMMUNITY): Payer: Self-pay | Admitting: Obstetrics & Gynecology

## 2013-06-30 DIAGNOSIS — Z90710 Acquired absence of both cervix and uterus: Secondary | ICD-10-CM | POA: Diagnosis not present

## 2013-06-30 NOTE — Anesthesia Postprocedure Evaluation (Signed)
  Anesthesia Post-op Note  Patient: Heidi Soto  Procedure(s) Performed: Procedure(s): HYSTERECTOMY ABDOMINAL (N/A) BILATERAL SALPINGECTOMY (Bilateral)  Patient Location: [facility]  Anesthesia Type:General  Level of Consciousness: awake, alert  and oriented  Airway and Oxygen Therapy: Patient Spontanous Breathing and Patient connected to nasal cannula oxygen  Post-op Pain: mild  Post-op Assessment: Post-op Vital signs reviewed and Patient's Cardiovascular Status Stable  Post-op Vital Signs: Reviewed and stable  Complications: No apparent anesthesia complications

## 2013-07-01 NOTE — Progress Notes (Signed)
Pt out in wheelchair  Teaching complete  Pt insrtucted to remove honeycomb  Dressing at home   Teaching complete  Sister  With pt

## 2013-07-01 NOTE — Progress Notes (Signed)
Subjective: Patient reports vomiting large amount of fluid last night but better since stopping Morphine PCA.  Tolerated crackers this am. Ambulated, voided since Foley out. Pain well controlled w PO Percocet.  Objective: I have reviewed patient's vital signs, intake and output, medications and labs. A&O x 3, no acute distress. Pleasant Lungs CTA bilat CV RRR, S1S2 normal Abdo soft, non tender, non acute. Dressing dry. Normal active bowel sounds.  Extr no edema/ tenderness Pelvic deferred. Pt reports no active bleeding  Assessment/Plan: POD1, s/p TAH. Bilateral salpingectomy for large fibroid uterus.  Doing well, continue to advance diet as tolerated, ambulate. PO pain meds. Anticipate D/c home tomorrow.   V.Haseeb Fiallos, MD

## 2013-07-01 NOTE — Discharge Summary (Signed)
Physician Discharge Summary  Patient ID: Heidi Soto MRN: [id number] DOB/AGE: 01/06/1971 41 y.o.  Admit date: 06/29/2013 Discharge date: 07/01/2013  Admission Diagnoses: Fibroids  Discharge Diagnoses:  Principal Problem:   S/P abdominal hysterectomy Active Problems:   Fibroids  Discharged Condition: good  Hospital Course: Uncomplicated, progressed well, normal vital signs, urine output, labs, and physical exam.   Discharge Exam: Normal post op exam, stable, dressing dry.   Disposition: Final discharge disposition not confirmed  Discharge Orders   Future Orders Complete By Expires     Call MD for:  difficulty breathing, headache or visual disturbances  As directed     Call MD for:  extreme fatigue  As directed     Call MD for:  hives  As directed     Call MD for:  persistant dizziness or light-headedness  As directed     Call MD for:  persistant nausea and vomiting  As directed     Call MD for:  redness, tenderness, or signs of infection (pain, swelling, redness, odor or green/yellow discharge around incision site)  As directed     Call MD for:  severe uncontrolled pain  As directed     Call MD for:  temperature >100.4  As directed     Call MD for:  As directed     Comments:      Heavy vagina bleeding    Change dressing (specify)  As directed     Comments:      Remove Honeycomb dressing 7 days after surgery. Keep incision clean and dry    Diet - low sodium heart healthy  As directed     Discharge instructions  As directed     Comments:      Take Ibuprofen 600 mg every 8 hrs as needed and alternate with Vicodin 1-2 tablets every 8 hrs in between as needed. Patient has been given Vicodin prescription from office.    Driving Restrictions  As directed     Comments:      2 wks    Increase activity slowly  As directed     Lifting restrictions  As directed     Comments:      6 wks    Sexual Activity Restrictions  As directed     Comments:      6 wks         Medication List         hydrochlorothiazide 25 MG tablet  Commonly known as:  HYDRODIURIL  Take 0.5 tablets (12.5 mg total) by mouth daily. NEED OFFICE VISIT     hydrocortisone cream 1 %  Apply 1 application topically 2 (two) times daily as needed (skin rash).     levothyroxine 88 MCG tablet  Commonly known as:  SYNTHROID, LEVOTHROID  Take 1 tablet (88 mcg total) by mouth daily.     multivitamin with minerals Tabs  Take 1 tablet by mouth daily.           Follow-up Information   Follow up with Harkirat Orozco R, MD. Schedule an appointment as soon as possible for a visit in 2 weeks.   Contact information:   [address] [place] [address] [phone]       Signed: Robley Fries 07/01/2013, 8:56 AM

## 2013-07-01 NOTE — Progress Notes (Signed)
2 Days Post-Op : HYSTERECTOMY ABDOMINAL (N/A), BILATERAL SALPINGECTOMY (Bilateral)  Subjective: Patient reports tolerating PO, + flatus and no problems voiding.  Pain well controlled.   Objective: I have reviewed patient's vital signs, intake and output, medications and labs.  General: alert and cooperative Resp: clear to auscultation bilaterally Cardio: regular rate and rhythm, S1, S2 normal, no murmur, click, rub or gallop GI: soft, non-tender; bowel sounds normal; no masses,  no organomegaly Extremities: extremities normal, atraumatic, no cyanosis or edema Vaginal Bleeding: none  Assessment: s/p Procedure(s): HYSTERECTOMY ABDOMINAL (N/A) BILATERAL SALPINGECTOMY (Bilateral) stable, progressing well and tolerating diet  Plan: Discharge home Surgical findings, post op care, warning s/s reviewed.    Heidi Soto R 07/01/2013, 8:16 AM

## 2013-07-16 ENCOUNTER — Ambulatory Visit (INDEPENDENT_AMBULATORY_CARE_PROVIDER_SITE_OTHER): Payer: BC Managed Care – PPO | Admitting: *Deleted

## 2013-07-16 VITALS — BP 120/80 | HR 82 | Temp 97.7°F | Resp 16

## 2013-07-16 DIAGNOSIS — Z23 Encounter for immunization: Secondary | ICD-10-CM

## 2014-01-28 ENCOUNTER — Other Ambulatory Visit: Payer: Self-pay | Admitting: Family Medicine

## 2014-01-30 ENCOUNTER — Other Ambulatory Visit: Payer: Self-pay | Admitting: Family Medicine

## 2014-03-13 ENCOUNTER — Ambulatory Visit (INDEPENDENT_AMBULATORY_CARE_PROVIDER_SITE_OTHER): Payer: BC Managed Care – PPO | Admitting: Family Medicine

## 2014-03-13 ENCOUNTER — Other Ambulatory Visit: Payer: Self-pay | Admitting: Family Medicine

## 2014-03-13 VITALS — BP 140/84 | HR 95 | Temp 97.9°F | Resp 18 | Ht 65.0 in | Wt 267.0 lb

## 2014-03-13 DIAGNOSIS — E039 Hypothyroidism, unspecified: Secondary | ICD-10-CM

## 2014-03-13 DIAGNOSIS — I10 Essential (primary) hypertension: Secondary | ICD-10-CM

## 2014-03-13 DIAGNOSIS — E669 Obesity, unspecified: Secondary | ICD-10-CM | POA: Insufficient documentation

## 2014-03-13 LAB — LIPID PANEL
CHOLESTEROL: 179 mg/dL (ref 0–200)
HDL: 45 mg/dL (ref >39)
LDL Cholesterol: 123 mg/dL — ABNORMAL HIGH (ref 0–99)
Total CHOL/HDL Ratio: 4 Ratio
Triglycerides: 57 mg/dL (ref <150)
VLDL: 11 mg/dL (ref 0–40)

## 2014-03-13 LAB — CBC
HEMATOCRIT: 38.7 % (ref 36.0–46.0)
HEMOGLOBIN: 12.9 g/dL (ref 12.0–15.0)
MCH: 26.8 pg (ref 26.0–34.0)
MCHC: 33.3 g/dL (ref 30.0–36.0)
MCV: 80.3 fL (ref 78.0–100.0)
Platelets: 288 10*3/uL (ref 150–400)
RBC: 4.82 MIL/uL (ref 3.87–5.11)
RDW: 14.8 % (ref 11.5–15.5)
WBC: 6.6 10*3/uL (ref 4.0–10.5)

## 2014-03-13 LAB — COMPREHENSIVE METABOLIC PANEL
ALBUMIN: 4 g/dL (ref 3.5–5.2)
ALT: 14 U/L (ref 0–35)
AST: 19 U/L (ref 0–37)
Alkaline Phosphatase: 73 U/L (ref 39–117)
BUN: 8 mg/dL (ref 6–23)
CALCIUM: 8.9 mg/dL (ref 8.4–10.5)
CO2: 29 mEq/L (ref 19–32)
Chloride: 101 mEq/L (ref 96–112)
Creat: 0.65 mg/dL (ref 0.50–1.10)
GLUCOSE: 92 mg/dL (ref 70–99)
Potassium: 3.8 mEq/L (ref 3.5–5.3)
SODIUM: 137 meq/L (ref 135–145)
TOTAL PROTEIN: 6.5 g/dL (ref 6.0–8.3)
Total Bilirubin: 0.5 mg/dL (ref 0.2–1.2)

## 2014-03-13 MED ORDER — LEVOTHYROXINE SODIUM 88 MCG PO TABS: 88.0000 ug | ORAL_TABLET | Freq: Every day | ORAL | Status: DC

## 2014-03-13 MED ORDER — HYDROCHLOROTHIAZIDE 25 MG PO TABS: 12.5000 mg | ORAL_TABLET | Freq: Every day | ORAL | Status: DC

## 2014-03-13 NOTE — Progress Notes (Addendum)
[facility] [address],  [place] [address] [phone]  Date:  03/13/2014   Name:  Heidi Soto   DOB:  [birth year]   MRN:  [id number]  PCP:  Robyn Haber, MD    Chief Complaint: Medication Refill   History of Present Illness:  Heidi Soto is a 43 y.o. very pleasant female patient who presents with the following:  Here today for medication refills.  She has been out of her HCTZ for the last couple of days. She is still taking her synthroid but is running low.  She is feeling overall well.  S/p hysterectomy.  She has noted some headache since she ran out of her HCTZ.    Patient Active Problem List   Diagnosis Date Noted  . S/P abdominal hysterectomy 06/30/2013  . H/O Graves' disease 12/30/2012  . Fibroids 12/30/2012    Past Medical History  Diagnosis Date  . Thyroid disease   . Graves disease   . Hypertension   . GYIRSWNI(627.0)     Past Surgical History  Procedure Laterality Date  . No past surgeries    . Abdominal hysterectomy N/A 06/29/2013    Procedure: HYSTERECTOMY ABDOMINAL;  Surgeon: Elveria Royals, MD;  Location: [facility];  Service: Gynecology;  Laterality: N/A;  . Bilateral salpingectomy Bilateral 06/29/2013    Procedure: BILATERAL SALPINGECTOMY;  Surgeon: Elveria Royals, MD;  Location: [facility];  Service: Gynecology;  Laterality: Bilateral;    History  Substance Use Topics  . Smoking status: Never Smoker   . Smokeless tobacco: Not on file  . Alcohol Use: No    Family History  Problem Relation Age of Onset  . COPD Mother   . Hypertension Mother   . Asthma Mother   . Hypertension Father   . Diabetes Father   . Cancer Father     colon and prostate  . Hypertension Sister   . Diabetes Sister   . Diabetes Brother   . Hypertension Brother   . Heart disease Paternal Grandmother   . Heart disease Paternal Grandfather     Allergies  Allergen Reactions  . Bactroban [Mupirocin Calcium] Rash    Medication list has been  reviewed and updated.  Current Outpatient Prescriptions on File Prior to Visit  Medication Sig Dispense Refill  . hydrochlorothiazide (HYDRODIURIL) 25 MG tablet Take 0.5 tablets (12.5 mg total) by mouth daily. PATIENT NEEDS OFFICE VISIT FOR ADDITIONAL REFILLS  15 tablet  0  . levothyroxine (SYNTHROID, LEVOTHROID) 88 MCG tablet Take 1 tablet (88 mcg total) by mouth daily.  90 tablet  3  . Multiple Vitamin (MULTIVITAMIN WITH MINERALS) TABS Take 1 tablet by mouth daily.      . hydrocortisone cream 1 % Apply 1 application topically 2 (two) times daily as needed (skin rash).       No current facility-administered medications on file prior to visit.    Review of Systems:  As per HPI- otherwise negative.   Physical Examination: Filed Vitals:   03/13/14 1218  BP: 140/84  Pulse: 95  Temp: 97.9 F (36.6 C)  Resp: 18   Filed Vitals:   03/13/14 1218  Height: 5\' 5"  (1.651 m)  Weight: 267 lb (121.11 kg)   Body mass index is 44.43 kg/(m^2). Ideal Body Weight: Weight in (lb) to have BMI = 25: 149.9  GEN: WDWN, NAD, Non-toxic, A & O x 3, obese HEENT: Atraumatic, Normocephalic. Neck supple. No masses, No LAD. Ears and Nose: No external deformity.  CV: RRR, No M/G/R. No JVD. No thrill. No extra heart sounds. PULM: CTA B, no wheezes, crackles, rhonchi. No retractions. No resp. distress. No accessory muscle use. EXTR: No c/c/e NEURO Normal gait.  PSYCH: Normally interactive. Conversant. Not depressed or anxious appearing.  Calm demeanor.    Assessment and Plan: Hypothyroidism - Plan: levothyroxine (SYNTHROID, LEVOTHROID) 88 MCG tablet, TSH  HTN (hypertension) - Plan: hydrochlorothiazide (HYDRODIURIL) 25 MG tablet, CBC, Comprehensive metabolic panel, Lipid panel, DISCONTINUED: hydrochlorothiazide (HYDRODIURIL) 25 MG tablet  Out of her BP medication.  Refill today Check TSH to ensure no dose change needed.    Write 90 day mailaway rx for synthroid once TSH in.    Signed Lamar Blinks,  MD Results for orders placed in visit on 03/13/14  CBC      Result Value Ref Range   WBC 6.6  4.0 - 10.5 K/uL   RBC 4.82  3.87 - 5.11 MIL/uL   Hemoglobin 12.9  12.0 - 15.0 g/dL   HCT 38.7  36.0 - 46.0 %   MCV 80.3  78.0 - 100.0 fL   MCH 26.8  26.0 - 34.0 pg   MCHC 33.3  30.0 - 36.0 g/dL   RDW 14.8  11.5 - 15.5 %   Platelets 288  150 - 400 K/uL  COMPREHENSIVE METABOLIC PANEL      Result Value Ref Range   Sodium 137  135 - 145 mEq/L   Potassium 3.8  3.5 - 5.3 mEq/L   Chloride 101  96 - 112 mEq/L   CO2 29  19 - 32 mEq/L   Glucose, Bld 92  70 - 99 mg/dL   BUN 8  6 - 23 mg/dL   Creat 0.65  0.50 - 1.10 mg/dL   Total Bilirubin 0.5  0.2 - 1.2 mg/dL   Alkaline Phosphatase 73  39 - 117 U/L   AST 19  0 - 37 U/L   ALT 14  0 - 35 U/L   Total Protein 6.5  6.0 - 8.3 g/dL   Albumin 4.0  3.5 - 5.2 g/dL   Calcium 8.9  8.4 - 10.5 mg/dL  LIPID PANEL      Result Value Ref Range   Cholesterol 179  0 - 200 mg/dL   Triglycerides 57  <150 mg/dL   HDL 45  >39 mg/dL   Total CHOL/HDL Ratio 4.0     VLDL 11  0 - 40 mg/dL   LDL Cholesterol 123 (*) 0 - 99 mg/dL  TSH      Result Value Ref Range   TSH 0.957  0.350 - 4.500 uIU/mL   Called and LMOM- TSH is ok, continue to take same dose,  Sent in rx to mail away

## 2014-03-13 NOTE — Patient Instructions (Signed)
I will be in touch with your TSH results and the rest of your labs- if you can, don't fill your synthroid until we talk.  If you want to sign up for mychart that would be even better!

## 2014-03-14 ENCOUNTER — Encounter: Payer: Self-pay | Admitting: Family Medicine

## 2014-03-14 LAB — TSH: TSH: 0.957 u[IU]/mL (ref 0.350–4.500)

## 2014-03-14 MED ORDER — LEVOTHYROXINE SODIUM 88 MCG PO TABS: 88.0000 ug | ORAL_TABLET | Freq: Every day | ORAL | Status: DC

## 2014-03-14 NOTE — Addendum Note (Signed)
Addended by: Lamar Blinks C on: 03/14/2014 10:54 AM   Modules accepted: Orders

## 2014-04-21 ENCOUNTER — Telehealth: Payer: Self-pay

## 2014-04-21 DIAGNOSIS — E039 Hypothyroidism, unspecified: Secondary | ICD-10-CM

## 2014-04-21 NOTE — Telephone Encounter (Signed)
Patient is requesting change her pharmacy  To [facility] on [address] she never received any through the mail.   Completely out of the medication.    [phone]

## 2014-04-23 NOTE — Telephone Encounter (Signed)
Pt is checking on status of her synthroid medication request   Best number [phone]

## 2014-04-24 MED ORDER — LEVOTHYROXINE SODIUM 88 MCG PO TABS: 88.0000 ug | ORAL_TABLET | Freq: Every day | ORAL | Status: DC

## 2014-04-24 NOTE — Telephone Encounter (Signed)
Pt called and told Maudia that she never got her levothyroxine when sent to [facility] and needs some locally. I had Maudia advise pt that I will resend the RFs to [facility] and also send in 1 mos to [facility]. Both sent.

## 2014-05-26 ENCOUNTER — Other Ambulatory Visit: Payer: Self-pay | Admitting: Family Medicine

## 2014-06-05 ENCOUNTER — Other Ambulatory Visit: Payer: Self-pay

## 2014-06-05 DIAGNOSIS — Z1231 Encounter for screening mammogram for malignant neoplasm of breast: Secondary | ICD-10-CM

## 2014-06-22 ENCOUNTER — Encounter (INDEPENDENT_AMBULATORY_CARE_PROVIDER_SITE_OTHER): Payer: Self-pay

## 2014-06-22 ENCOUNTER — Ambulatory Visit
Admission: RE | Admit: 2014-06-22 | Discharge: 2014-06-22 | Disposition: A | Discharge status: Home / Self Care | Payer: BC Managed Care – PPO | Source: Ambulatory Visit

## 2014-06-22 DIAGNOSIS — Z1231 Encounter for screening mammogram for malignant neoplasm of breast: Secondary | ICD-10-CM

## 2014-06-23 ENCOUNTER — Other Ambulatory Visit: Payer: Self-pay | Admitting: Obstetrics & Gynecology

## 2014-06-23 DIAGNOSIS — R928 Other abnormal and inconclusive findings on diagnostic imaging of breast: Secondary | ICD-10-CM

## 2014-07-04 ENCOUNTER — Ambulatory Visit
Admission: RE | Admit: 2014-07-04 | Discharge: 2014-07-04 | Disposition: A | Discharge status: Home / Self Care | Payer: BC Managed Care – PPO | Source: Ambulatory Visit | Attending: Obstetrics & Gynecology | Admitting: Obstetrics & Gynecology

## 2014-07-04 DIAGNOSIS — R928 Other abnormal and inconclusive findings on diagnostic imaging of breast: Secondary | ICD-10-CM

## 2014-09-21 ENCOUNTER — Other Ambulatory Visit: Payer: Self-pay | Admitting: Orthopedic Surgery

## 2014-09-21 DIAGNOSIS — M25551 Pain in right hip: Secondary | ICD-10-CM

## 2014-10-04 ENCOUNTER — Ambulatory Visit
Admission: RE | Admit: 2014-10-04 | Discharge: 2014-10-04 | Disposition: A | Discharge status: Home / Self Care | Payer: BC Managed Care – PPO | Source: Ambulatory Visit | Attending: Orthopedic Surgery | Admitting: Orthopedic Surgery

## 2014-10-04 DIAGNOSIS — M25551 Pain in right hip: Secondary | ICD-10-CM

## 2014-10-04 MED ORDER — IOHEXOL 180 MG/ML  SOLN
15.0000 mL | Freq: Once | INTRAMUSCULAR | Status: AC | PRN
  Administered 2014-10-04: 15 mL via INTRA_ARTICULAR

## 2014-12-04 ENCOUNTER — Other Ambulatory Visit: Payer: Self-pay | Admitting: Obstetrics & Gynecology

## 2014-12-04 DIAGNOSIS — N63 Unspecified lump in unspecified breast: Secondary | ICD-10-CM

## 2015-01-05 ENCOUNTER — Other Ambulatory Visit: Payer: BC Managed Care – PPO

## 2015-01-11 ENCOUNTER — Ambulatory Visit
Admission: RE | Admit: 2015-01-11 | Discharge: 2015-01-11 | Disposition: A | Discharge status: Home / Self Care | Payer: BC Managed Care – PPO | Source: Ambulatory Visit | Attending: Obstetrics & Gynecology | Admitting: Obstetrics & Gynecology

## 2015-01-11 DIAGNOSIS — N63 Unspecified lump in unspecified breast: Secondary | ICD-10-CM

## 2015-03-24 ENCOUNTER — Other Ambulatory Visit: Payer: Self-pay | Admitting: Family Medicine

## 2015-06-05 ENCOUNTER — Other Ambulatory Visit: Payer: Self-pay | Admitting: Family Medicine

## 2015-06-08 ENCOUNTER — Ambulatory Visit (INDEPENDENT_AMBULATORY_CARE_PROVIDER_SITE_OTHER): Payer: BC Managed Care – PPO | Admitting: Family Medicine

## 2015-06-08 VITALS — BP 121/78 | HR 60 | Temp 98.5°F | Resp 17 | Ht 66.0 in | Wt 211.0 lb

## 2015-06-08 DIAGNOSIS — Z0000 Encounter for general adult medical examination without abnormal findings: Secondary | ICD-10-CM

## 2015-06-08 DIAGNOSIS — I10 Essential (primary) hypertension: Secondary | ICD-10-CM | POA: Diagnosis not present

## 2015-06-08 DIAGNOSIS — E039 Hypothyroidism, unspecified: Secondary | ICD-10-CM

## 2015-06-08 MED ORDER — HYDROCHLOROTHIAZIDE 25 MG PO TABS: ORAL_TABLET | ORAL | Status: DC

## 2015-06-08 MED ORDER — LEVOTHYROXINE SODIUM 88 MCG PO TABS: 88.0000 ug | ORAL_TABLET | Freq: Every day | ORAL | Status: DC

## 2015-06-08 NOTE — Patient Instructions (Signed)
You should receive a call, email, or letter about your lab results within the next week to 10 days.  Same dose of meds for now until results received.   Keeping You Healthy  Get These Tests 1. Blood Pressure- Have your blood pressure checked once a year by your health care provider.  Normal blood pressure is 120/80. 2. Weight- Have your body mass index (BMI) calculated to screen for obesity.  BMI is measure of body fat based on height and weight.  You can also calculate your own BMI at GravelBags.it. 3. Cholesterol- Have your cholesterol checked every 5 years starting at age 43 then yearly starting at age 70. 63. Chlamydia, HIV, and other sexually transmitted diseases- Get screened every year until age 43, then within three months of each new sexual provider. 5. Pap Test - Every 1-5 years; discuss with your health care provider. 6. Mammogram- Every 1-2 years starting at age 81--43  Take these medicines  Calcium with Vitamin D-Your body needs 1200 mg of Calcium each day and [phone] IU of Vitamin D daily.  Your body can only absorb 500 mg of Calcium at a time so Calcium must be taken in 2 or 3 divided doses throughout the day.  Multivitamin with folic acid- Once daily if it is possible for you to become pregnant.  Get these Immunizations  Gardasil-Series of three doses; prevents HPV related illness such as genital warts and cervical cancer.  Menactra-Single dose; prevents meningitis.  Tetanus shot- Every 10 years.  Flu shot-Every year.  Take these steps 1. Do not smoke-Your healthcare provider can help you quit.  For tips on how to quit go to www.smokefree.gov or call 1-800 QUITNOW. 2. Be physically active- Exercise 5 days a week for at least 30 minutes.  If you are not already physically active, start slow and gradually work up to 30 minutes of moderate physical activity.  Examples of moderate activity include walking briskly, dancing, swimming, bicycling, etc. 3. Breast  Cancer- A self breast exam every month is important for early detection of breast cancer.  For more information and instruction on self breast exams, ask your healthcare provider or https://www.patel.info/. 4. Eat a healthy diet- Eat a variety of healthy foods such as fruits, vegetables, whole grains, low fat milk, low fat cheeses, yogurt, lean meats, poultry and fish, beans, nuts, tofu, etc.  For more information go to www. Thenutritionsource.org 5. Drink alcohol in moderation- Limit alcohol intake to one drink or less per day. Never drink and drive. 6. Depression- Your emotional health is as important as your physical health.  If you're feeling down or losing interest in things you normally enjoy please talk to your healthcare provider about being screened for depression. 7. Dental visit- Brush and floss your teeth twice daily; visit your dentist twice a year. 8. Eye doctor- Get an eye exam at least every 2 years. 9. Helmet use- Always wear a helmet when riding a bicycle, motorcycle, rollerblading or skateboarding. 43. Safe sex- If you may be exposed to sexually transmitted infections, use a condom. 11. Seat belts- Seat belts can save your live; always wear one. 12. Smoke/Carbon Monoxide detectors- These detectors need to be installed on the appropriate level of your home. Replace batteries at least once a year. 13. Skin cancer- When out in the sun please cover up and use sunscreen 15 SPF or higher. 14. Violence- If anyone is threatening or hurting you, please tell your healthcare provider.

## 2015-06-08 NOTE — Progress Notes (Addendum)
Subjective:  This chart was scribed for Merri Ray, MD by Moises Blood, Medical Scribe. This patient was seen in room 3 and the patient's care was started 11:10 AM.    Patient ID: Heidi Soto, female    DOB: [birth year], 44 y.o.   MRN: [id number]  HPI Heidi Soto is a 44 y.o. female Here for annual exam and also refill of medication.    Cancer Screening Breast Cancer: Mammogram - Jan 2016. Including diagnostic, probable benign small right breast mass likely a small fibroadenoma. Repeat ultrasound in 6 months. Haven't noticed lumps and bumps. No Fhx of breast cancer.  Cervical Cancer Screening: Hx of hysterectomy fibroids. Hysterectomy in July 2014.   Immunization Immunization History  Administered Date(s) Administered  . Influenza, Seasonal, Injecte, Preservative Fre 12/30/2012  . Tdap 07/16/2013    Depression Depression screen PHQ 2/9 06/08/2015  Decreased Interest 0  Down, Depressed, Hopeless 0  PHQ - 2 Score 0    Hypothyroidism Hx of Graves' disease; right eye bulges   Takes synthroid 88 mcg QD Lab Results  Component Value Date   TSH 0.957 03/13/2014   Sibling passed Oct 2015. Has been losing weight: 267 lbs last year and 211 lbs today.   HTN On HCTZ 25 mg 1 half tablet QD Lab Results  Component Value Date   CREATININE 0.65 03/13/2014    Lab Results  Component Value Date   CHOL 179 03/13/2014   HDL 45 03/13/2014   LDLCALC 123* 03/13/2014   TRIG 57 03/13/2014   CHOLHDL 4.0 03/13/2014   Checks blood pressure at home sometimes. Usually gets 120/70 range.   Exercise 3-4 times a week   Eye Doctor Got new glasses in August 2015  Dentist Has not seen dentist in a while  Wisdom teeth out years ago     Patient Active Problem List   Diagnosis Date Noted  . Obesity, unspecified 03/13/2014  . S/P abdominal hysterectomy 06/30/2013  . H/O Graves' disease 12/30/2012  . Fibroids 12/30/2012   Past Medical History  Diagnosis Date  . Thyroid  disease   . Graves disease   . Hypertension   . UKGURKYH(062.3)    Past Surgical History  Procedure Laterality Date  . No past surgeries    . Abdominal hysterectomy N/A 06/29/2013    Procedure: HYSTERECTOMY ABDOMINAL;  Surgeon: Elveria Royals, MD;  Location: [facility];  Service: Gynecology;  Laterality: N/A;  . Bilateral salpingectomy Bilateral 06/29/2013    Procedure: BILATERAL SALPINGECTOMY;  Surgeon: Elveria Royals, MD;  Location: [facility];  Service: Gynecology;  Laterality: Bilateral;   Allergies  Allergen Reactions  . Bactroban [Mupirocin Calcium] Rash   Prior to Admission medications   Medication Sig Start Date End Date Taking? Authorizing Provider  hydrochlorothiazide (HYDRODIURIL) 25 MG tablet TAKE ONE-HALF (1/2) TABLET (12.5 MG TOTAL) DAILY.  "OV NEEDED FOR ADDITIONAL REFILLS" 03/25/15  Yes Chelle Jeffery, PA-C  levothyroxine (SYNTHROID, LEVOTHROID) 88 MCG tablet Take 1 tablet (88 mcg total) by mouth daily. 04/24/14  Yes Gay Filler Copland, MD  levothyroxine (SYNTHROID, LEVOTHROID) 88 MCG tablet TAKE 1 TABLET BY MOUTH DAILY   Yes Robyn Haber, MD  Multiple Vitamin (MULTIVITAMIN WITH MINERALS) TABS Take 1 tablet by mouth daily.   Yes Historical Provider, MD  hydrocortisone cream 1 % Apply 1 application topically 2 (two) times daily as needed (skin rash).    Historical Provider, MD   History   Social History  . Marital Status: Single    Spouse  Name: N/A  . Number of Children: N/A  . Years of Education: N/A   Occupational History  . Not on file.   Social History Main Topics  . Smoking status: Never Smoker   . Smokeless tobacco: Not on file  . Alcohol Use: No  . Drug Use: No  . Sexual Activity: Not on file   Other Topics Concern  . Not on file   Social History Narrative        Review of Systems  Constitutional: Negative for fatigue and unexpected weight change.  Respiratory: Negative for chest tightness and shortness of breath.   Cardiovascular: Negative for  chest pain, palpitations and leg swelling.  Gastrointestinal: Negative for abdominal pain and blood in stool.  Neurological: Negative for dizziness, syncope, light-headedness and headaches.   13 point ROS Review on patient survey Positive for runny nose only    Objective:   Physical Exam  Constitutional: She is oriented to person, place, and time. She appears well-developed and well-nourished.  HENT:  Head: Normocephalic and atraumatic.  Right Ear: External ear normal.  Left Ear: External ear normal.  Mouth/Throat: Oropharynx is clear and moist.  Eyes: Conjunctivae and EOM are normal. Pupils are equal, round, and reactive to light.  Slight proptosis  Neck: Normal range of motion. Neck supple. Carotid bruit is not present. No thyromegaly present.  Cardiovascular: Normal rate, regular rhythm, normal heart sounds and intact distal pulses.   No murmur heard. Pulmonary/Chest: Effort normal and breath sounds normal. No respiratory distress. She has no wheezes.  Right breast: no apparent nodules, no skin retractions, no axillary irregularities Left breast: no apparent nodules, no skin retractions, no axillary irregularities    Abdominal: Soft. Bowel sounds are normal. She exhibits no pulsatile midline mass. There is no tenderness.  Musculoskeletal: Normal range of motion. She exhibits no edema or tenderness.  Lymphadenopathy:    She has no cervical adenopathy.  Neurological: She is alert and oriented to person, place, and time.  Skin: Skin is warm and dry. No rash noted.  Psychiatric: She has a normal mood and affect. Her behavior is normal. Thought content normal.  Vitals reviewed.   Filed Vitals:   06/08/15 1008  BP: 121/78  Pulse: 60  Temp: 98.5 F (36.9 C)  TempSrc: Oral  Resp: 17  Height: 5\' 6"  (1.676 m)  Weight: 211 lb (95.709 kg)  SpO2: 98%    Visual Acuity Screening   Right eye Left eye Both eyes  Without correction:     With correction: 20/15 20/13 20/13    Wt  Readings from Last 3 Encounters:  06/08/15 211 lb (95.709 kg)  03/13/14 267 lb (121.11 kg)  06/29/13 264 lb (119.75 kg)         Assessment & Plan:   Heidi Soto is a 44 y.o. female Annual physical exam -anticipatory guidance as below in AVS, screening labs above. Health maintenance items as above in HPI discussed/recommended as applicable.   Essential hypertension - Plan: Lipid panel, COMPLETE METABOLIC PANEL WITH GFR, hydrochlorothiazide (HYDRODIURIL) 25 MG tablet  -Stable. No changes in medicines at this point, labs pending as above.  Hypothyroidism, unspecified hypothyroidism type - Plan: TSH, levothyroxine (SYNTHROID, LEVOTHROID) 88 MCG tablet  -TSH pending, continue same dose of Synthroid at this time   Meds ordered this encounter  Medications  . levothyroxine (SYNTHROID, LEVOTHROID) 88 MCG tablet    Sig: Take 1 tablet (88 mcg total) by mouth daily.    Dispense:  90 tablet  Refill:  2  . hydrochlorothiazide (HYDRODIURIL) 25 MG tablet    Sig: TAKE ONE-HALF (1/2) TABLET (12.5 MG TOTAL) DAILY.    Dispense:  90 tablet    Refill:  1   Patient Instructions  You should receive a call, email, or letter about your lab results within the next week to 10 days.  Same dose of meds for now until results received.   Keeping You Healthy  Get These Tests 1. Blood Pressure- Have your blood pressure checked once a year by your health care provider.  Normal blood pressure is 120/80. 2. Weight- Have your body mass index (BMI) calculated to screen for obesity.  BMI is measure of body fat based on height and weight.  You can also calculate your own BMI at [employer]velBags[employer]it. 3. Cholesterol- Have your cholesterol checked every 5 years starting at age 16 then yearly starting at age 25. 41. Chlamydia, HIV, and other sexually transmitted diseases- Get screened every year until age 41, then within three months of each new sexual provider. 5. Pap Test - Every 1-5 years; discuss with  your health care provider. 6. Mammogram- Every 1-2 years starting at age 21--50  Take these medicines  Calcium with Vitamin D-Your body needs 1200 mg of Calcium each day and [phone] IU of Vitamin D daily.  Your body can only absorb 500 mg of Calcium at a time so Calcium must be taken in 2 or 3 divided doses throughout the day.  Multivitamin with folic acid- Once daily if it is possible for you to become pregnant.  Get these Immunizations  Gardasil-Series of three doses; prevents HPV related illness such as genital warts and cervical cancer.  Menactra-Single dose; prevents meningitis.  Tetanus shot- Every 10 years.  Flu shot-Every year.  Take these steps 1. Do not smoke-Your healthcare provider can help you quit.  For tips on how to quit go to www.smokefree.gov or call 1-800 QUITNOW. 2. Be physically active- Exercise 5 days a week for at least 30 minutes.  If you are not already physically active, start slow and gradually work up to 30 minutes of moderate physical activity.  Examples of moderate activity include walking briskly, dancing, swimming, bicycling, etc. 3. Breast Cancer- A self breast exam every month is important for early detection of breast cancer.  For more information and instruction on self breast exams, ask your healthcare provider or https://www.patel.info/. 4. Eat a healthy diet- Eat a variety of healthy foods such as fruits, vegetables, whole grains, low fat milk, low fat cheeses, yogurt, lean meats, poultry and fish, beans, nuts, tofu, etc.  For more information go to www. Thenutritionsource.org 5. Drink alcohol in moderation- Limit alcohol intake to one drink or less per day. Never drink and drive. 6. Depression- Your emotional health is as important as your physical health.  If you're feeling down or losing interest in things you normally enjoy please talk to your healthcare provider about being screened for depression. 7. Dental visit- Brush and  floss your teeth twice daily; visit your dentist twice a year. 8. Eye doctor- Get an eye exam at least every 2 years. 9. Helmet use- Always wear a helmet when riding a bicycle, motorcycle, rollerblading or skateboarding. 35. Safe sex- If you may be exposed to sexually transmitted infections, use a condom. 11. Seat belts- Seat belts can save your live; always wear one. 12. Smoke/Carbon Monoxide detectors- These detectors need to be installed on the appropriate level of your home. Replace batteries at least once  a year. 13. Skin cancer- When out in the sun please cover up and use sunscreen 15 SPF or higher. 14. Violence- If anyone is threatening or hurting you, please tell your healthcare provider.            I personally performed the services described in this documentation, which was scribed in my presence. The recorded information has been reviewed and considered, and addended by me as needed.

## 2015-06-09 LAB — COMPLETE METABOLIC PANEL WITH GFR
ALBUMIN: 4 g/dL (ref 3.5–5.2)
ALT: 16 U/L (ref 0–35)
AST: 23 U/L (ref 0–37)
Alkaline Phosphatase: 75 U/L (ref 39–117)
BUN: 15 mg/dL (ref 6–23)
CALCIUM: 9.4 mg/dL (ref 8.4–10.5)
CO2: 27 mEq/L (ref 19–32)
Chloride: 100 mEq/L (ref 96–112)
Creat: 0.78 mg/dL (ref 0.50–1.10)
GFR, Est African American: >89 mL/min
GFR, Est Non African American: >89 mL/min
GLUCOSE: 95 mg/dL (ref 70–99)
Potassium: 4.4 mEq/L (ref 3.5–5.3)
Sodium: 140 mEq/L (ref 135–145)
Total Bilirubin: 0.8 mg/dL (ref 0.2–1.2)
Total Protein: 6.8 g/dL (ref 6.0–8.3)

## 2015-06-09 LAB — LIPID PANEL
Cholesterol: 219 mg/dL — ABNORMAL HIGH (ref 0–200)
HDL: 56 mg/dL (ref >=46)
LDL CALC: 152 mg/dL — AB (ref 0–99)
TRIGLYCERIDES: 55 mg/dL (ref <150)
Total CHOL/HDL Ratio: 3.9 Ratio
VLDL: 11 mg/dL (ref 0–40)

## 2015-06-09 LAB — TSH: TSH: 3.964 u[IU]/mL (ref 0.350–4.500)

## 2015-06-21 ENCOUNTER — Other Ambulatory Visit: Payer: Self-pay | Admitting: Obstetrics & Gynecology

## 2015-06-21 ENCOUNTER — Other Ambulatory Visit: Payer: Self-pay | Admitting: Family Medicine

## 2015-06-21 DIAGNOSIS — N6310 Unspecified lump in the right breast, unspecified quadrant: Secondary | ICD-10-CM

## 2015-07-12 ENCOUNTER — Ambulatory Visit
Admission: RE | Admit: 2015-07-12 | Discharge: 2015-07-12 | Disposition: A | Discharge status: Home / Self Care | Payer: BC Managed Care – PPO | Source: Ambulatory Visit | Attending: Obstetrics & Gynecology | Admitting: Obstetrics & Gynecology

## 2015-07-12 ENCOUNTER — Other Ambulatory Visit: Payer: Self-pay | Admitting: Obstetrics & Gynecology

## 2015-07-12 DIAGNOSIS — N6310 Unspecified lump in the right breast, unspecified quadrant: Secondary | ICD-10-CM

## 2016-03-02 ENCOUNTER — Other Ambulatory Visit: Payer: Self-pay | Admitting: Family Medicine

## 2016-04-14 ENCOUNTER — Other Ambulatory Visit: Payer: Self-pay

## 2016-04-14 DIAGNOSIS — I10 Essential (primary) hypertension: Secondary | ICD-10-CM

## 2016-04-14 MED ORDER — LEVOTHYROXINE SODIUM 88 MCG PO TABS: 88.0000 ug | ORAL_TABLET | Freq: Every day | ORAL | Status: DC

## 2016-04-14 MED ORDER — HYDROCHLOROTHIAZIDE 25 MG PO TABS: ORAL_TABLET | ORAL | Status: DC

## 2016-05-04 ENCOUNTER — Other Ambulatory Visit: Payer: Self-pay | Admitting: Family Medicine

## 2016-05-05 NOTE — Telephone Encounter (Signed)
Patient will need an appointment before next refill runs out

## 2016-05-19 ENCOUNTER — Ambulatory Visit (INDEPENDENT_AMBULATORY_CARE_PROVIDER_SITE_OTHER): Payer: BC Managed Care – PPO | Admitting: Family Medicine

## 2016-05-19 VITALS — BP 126/82 | HR 65 | Temp 97.9°F | Resp 18 | Ht 66.0 in | Wt 217.0 lb

## 2016-05-19 DIAGNOSIS — E785 Hyperlipidemia, unspecified: Secondary | ICD-10-CM | POA: Diagnosis not present

## 2016-05-19 DIAGNOSIS — E039 Hypothyroidism, unspecified: Secondary | ICD-10-CM

## 2016-05-19 DIAGNOSIS — I10 Essential (primary) hypertension: Secondary | ICD-10-CM

## 2016-05-19 MED ORDER — HYDROCHLOROTHIAZIDE 25 MG PO TABS: ORAL_TABLET | ORAL | Status: DC

## 2016-05-19 MED ORDER — LEVOTHYROXINE SODIUM 88 MCG PO TABS: 88.0000 ug | ORAL_TABLET | Freq: Every day | ORAL | Status: DC

## 2016-05-19 NOTE — Progress Notes (Signed)
Subjective:  By signing my name below, I, Raven Small, attest that this documentation has been prepared under the direction and in the presence of Merri Ray, MD.  Electronically Signed: Thea Alken, ED Scribe. 05/19/2016. 8:09 PM.  Patient ID: Heidi Soto, female    DOB: 1971-05-15, 45 y.o.   MRN: [id number]  HPI   Chief Complaint  Patient presents with  . Medication Refill    levothyroxine,hydrochlorothiazide    HPI Comments: Heidi Soto is a 45 y.o. female who presents to the [facility] for a medication refill.   Hypothyroidism   Lab Results  Component Value Date   TSH 3.964 06/08/2015   She is taking synthroid 88 mcg. She denies adverse effects with medication including weight gain. She ran out of medication 2 days ago.   Wt Readings from Last 3 Encounters:  05/19/16 217 lb (98.431 kg)  06/08/15 211 lb (95.709 kg)  03/13/14 267 lb (121.11 kg)   Pt has been exercising regularly. She has maintained a healthy diet but admits to indulging in sweets occasionally.  Hypertension   Lab Results  Component Value Date   CREATININE 0.78 06/08/2015   She is on HCTZ 12.5 mg a day. Pt reports BP home readings range of 120-130/70. She has been out of medication for 2 days.   Hyperlipidemia   Lab Results  Component Value Date   ALT 16 06/08/2015   AST 23 06/08/2015   ALKPHOS 75 06/08/2015   BILITOT 0.8 06/08/2015    Lab Results  Component Value Date   CHOL 219* 06/08/2015   HDL 56 06/08/2015   LDLCALC 152* 06/08/2015   TRIG 55 06/08/2015   CHOLHDL 3.9 06/08/2015   She is not on medication currently. Recommended continue to work on diet and exercise with repeat testing in 6 month.  Pt last ate at 3pm.   Pt works at [employer].   Patient Active Problem List   Diagnosis Date Noted  . Obesity, unspecified 03/13/2014  . S/P abdominal hysterectomy 06/30/2013  . H/O Graves' disease 12/30/2012  .  Fibroids 12/30/2012   Past Medical History  Diagnosis Date  . Thyroid disease   . Graves disease   . Hypertension   . [id number]    Past Surgical History  Procedure Laterality Date  . No past surgeries    . Abdominal hysterectomy N/A 06/29/2013    Procedure: HYSTERECTOMY ABDOMINAL;  Surgeon: Elveria Royals, MD;  Location: [place] [facility];  Service: Gynecology;  Laterality: N/A;  . Bilateral salpingectomy Bilateral 06/29/2013    Procedure: BILATERAL SALPINGECTOMY;  Surgeon: Elveria Royals, MD;  Location: [place] [facility];  Service: Gynecology;  Laterality: Bilateral;   Allergies  Allergen Reactions  . Bactroban [Mupirocin Calcium] Rash   Prior to Admission medications   Medication Sig Start Date End Date Taking? Authorizing Provider  hydrochlorothiazide (HYDRODIURIL) 25 MG tablet TAKE ONE-HALF (1/2) TABLET (12.5 MG TOTAL) DAILY. 04/14/16  Yes Wendie Agreste, MD  levothyroxine (SYNTHROID, LEVOTHROID) 88 MCG tablet TAKE 1 TABLET DAILY        (PATIENT NEEDS OFFICE VISITFOR ADDITIONAL REFILLS) 05/05/16  Yes Robyn Haber, MD  hydrocortisone cream 1 % Apply 1 application topically 2 (two) times daily as needed (skin rash). Reported on 05/19/2016    Historical Provider, MD  Multiple Vitamin (MULTIVITAMIN WITH MINERALS) TABS Take 1 tablet by mouth daily. Reported on 05/19/2016    Historical Provider, MD   Social History   Social  History  . Marital Status: Single    Spouse Name: N/A  . Number of Children: N/A  . Years of Education: N/A   Occupational History  . Not on file.   Social History Main Topics  . Smoking status: Never Smoker   . Smokeless tobacco: Not on file  . Alcohol Use: No  . Drug Use: No  . Sexual Activity: Not on file   Other Topics Concern  . Not on file   Social History Narrative   Review of Systems  Constitutional: Negative for fatigue and unexpected weight change.  Respiratory: Negative for chest tightness and shortness of breath.   Cardiovascular: Negative for chest  pain, palpitations and leg swelling.  Gastrointestinal: Negative for abdominal pain and blood in stool.  Neurological: Negative for dizziness, syncope, light-headedness and headaches.    Objective:   Physical Exam  Constitutional: She is oriented to person, place, and time. She appears well-developed and well-nourished.  HENT:  Head: Normocephalic and atraumatic.  Eyes: Conjunctivae and EOM are normal. Pupils are equal, round, and reactive to light.  Neck: Carotid bruit is not present.  Slight fullness of thyroid without nodules.   Cardiovascular: Normal rate, regular rhythm, normal heart sounds and intact distal pulses.   Pulmonary/Chest: Effort normal and breath sounds normal.  Abdominal: Soft. She exhibits no pulsatile midline mass. There is no tenderness.  Musculoskeletal: She exhibits no edema.  Neurological: She is alert and oriented to person, place, and time.  Skin: Skin is warm and dry.  Psychiatric: She has a normal mood and affect. Her behavior is normal.  Vitals reviewed.   Filed Vitals:   05/19/16 1715  BP: 126/82  Pulse: 65  Temp: 97.9 F (36.6 C)  TempSrc: Oral  Resp: 18  Height: 5\' 6"  (1.676 m)  Weight: 217 lb (98.431 kg)  SpO2: 99%     Assessment & Plan:   Heidi Soto is a 45 y.o. female Hypothyroidism, unspecified hypothyroidism type - Plan: TSH, levothyroxine (SYNTHROID, LEVOTHROID) 88 MCG tablet, DISCONTINUED: levothyroxine (SYNTHROID, LEVOTHROID) 88 MCG tablet  - Check TSH, continue same dose of Synthroid for now. 30 day supply to fill locally as she is out and 90 day with 1 refill sent to pharmacy. Plan on physical/recheck within the next 6 months.  Dyslipidemia - Plan: Lipid panel  - Check lipids. Not truly 8 hours fasting, so if elevated, may need lab only order for 8 fasting repeat labs. Continue to work on diet, exercise.  Essential hypertension - Plan: COMPLETE METABOLIC PANEL WITH GFR, hydrochlorothiazide (HYDRODIURIL) 25 MG tablet,  DISCONTINUED: hydrochlorothiazide (HYDRODIURIL) 25 MG tablet  - Stable, and actually appears overall controlled off of medicines for a few days.  -Refilled HCTZ at 12.5 mg daily, but she has a option of stopping the medicine for a week and monitor blood pressure each day. If remains under 140/90, may be able to stop meds as she has had some weight loss.    Recheck CPE in next 6 months.   Meds ordered this encounter  Medications  . DISCONTD: hydrochlorothiazide (HYDRODIURIL) 25 MG tablet    Sig: TAKE ONE-HALF (1/2) TABLET (12.5 MG TOTAL) DAILY.    Dispense:  15 tablet    Refill:  0  . DISCONTD: levothyroxine (SYNTHROID, LEVOTHROID) 88 MCG tablet    Sig: Take 1 tablet (88 mcg total) by mouth daily before breakfast.    Dispense:  30 tablet    Refill:  0  . hydrochlorothiazide (HYDRODIURIL) 25 MG tablet  Sig: TAKE ONE-HALF (1/2) TABLET (12.5 MG TOTAL) DAILY.    Dispense:  45 tablet    Refill:  1  . levothyroxine (SYNTHROID, LEVOTHROID) 88 MCG tablet    Sig: Take 1 tablet (88 mcg total) by mouth daily before breakfast.    Dispense:  90 tablet    Refill:  1   Patient Instructions       IF you received an x-ray today, you will receive an invoice from [facility] Rad[facility]. Please contact [facility]iology at [phone] with questions or concerns regarding your invoice.   IF you received labwork today, you will receive an invoice from [employer]. Please contact [facility] at [phone] with questions or concerns regarding your invoice.   Our billing staff will not be able to assist you with questions regarding bills from these companies.  You will be contacted with the lab results as soon as they are available. The fastest way to get your results is to activate your My Chart account. Instructions are located on the last page of this paperwork. If you have not heard from [place] regarding the results in 2 weeks, please contact this office.    We recommend  that you schedule a mammogram for breast cancer screening. Typically, you do not need a referral to do this. Please contact a local imaging center to schedule your mammogram.  [facility] - [phone]  *ask for the Radiology Department [facility] [place] ([place]) - [phone] or [phone]  [facility] - [phone] [place] [phone] [facility] - [phone]  *ask for the [facility] - [phone]  *ask for the Radiology Department [facility] - [phone]  *ask for the [place] - [phone]  Continue to work on diet, continue exercise and activity.  If cholesterol is elevated, We can recheck that with 8 hour fasting labs. Follow-up in the next 6 months for physical. Sooner if any new or worsening symptoms.       I personally performed the services described in this documentation, which was scribed in my presence. The recorded information has been reviewed and considered, and addended by me as needed.   Signed,   Merri Ray, MD [facility].  05/20/2016 2:58 PM

## 2016-05-19 NOTE — Patient Instructions (Addendum)
     IF you received an x-ray today, you will receive an invoice from [facility] Radiology. Please contact [facility] Radiology at [phone] with questions or concerns regarding your invoice.   IF you received labwork today, you will receive an invoice from [employer]. Please contact [facility] at [phone] with questions or concerns regarding your invoice.   Our billing staff will not be able to assist you with questions regarding bills from these companies.  You will be contacted with the lab results as soon as they are available. The fastest way to get your results is to activate your My Chart account. Instructions are located on the last page of this paperwork. If you have not heard from [place] regarding the results in 2 weeks, please contact this office.    We recommend that you schedule a mammogram for breast cancer screening. Typically, you do not need a referral to do this. Please contact a local imaging center to schedule your mammogram.  [facility] - [phone]  *ask for the Radiology Department [facility] [place] ([place]) - [phone] or [phone]  [facility] - [phone] [place] [phone] [facility] - [phone]  *ask for the [facility] - [phone]  *ask for the Radiology Department [facility] - [phone]  *ask for the [place] - [phone]  Continue to work on diet, continue exercise and activity.  If cholesterol is elevated, We can recheck that with 8 hour fasting labs. Follow-up in the next 6 months for physical. Sooner if any new or worsening symptoms.

## 2016-05-20 LAB — LIPID PANEL
CHOL/HDL RATIO: 2.9 ratio (ref <=5.0)
CHOLESTEROL: 194 mg/dL (ref 125–200)
HDL: 68 mg/dL (ref >=46)
LDL Cholesterol: 110 mg/dL (ref <130)
TRIGLYCERIDES: 82 mg/dL (ref <150)
VLDL: 16 mg/dL (ref <30)

## 2016-05-20 LAB — COMPLETE METABOLIC PANEL WITH GFR
ALBUMIN: 4.1 g/dL (ref 3.6–5.1)
ALT: 16 U/L (ref 6–29)
AST: 20 U/L (ref 10–30)
Alkaline Phosphatase: 75 U/L (ref 33–115)
BILIRUBIN TOTAL: 0.5 mg/dL (ref 0.2–1.2)
BUN: 16 mg/dL (ref 7–25)
CALCIUM: 9.1 mg/dL (ref 8.6–10.2)
CO2: 25 mmol/L (ref 20–31)
Chloride: 101 mmol/L (ref 98–110)
Creat: 0.81 mg/dL (ref 0.50–1.10)
GFR, EST NON AFRICAN AMERICAN: 89 mL/min (ref >=60)
Glucose, Bld: 83 mg/dL (ref 65–99)
POTASSIUM: 3.7 mmol/L (ref 3.5–5.3)
SODIUM: 138 mmol/L (ref 135–146)
TOTAL PROTEIN: 6.4 g/dL (ref 6.1–8.1)

## 2016-05-20 LAB — TSH: TSH: 9.18 mIU/L — ABNORMAL HIGH

## 2016-05-21 ENCOUNTER — Other Ambulatory Visit: Payer: Self-pay | Admitting: Family Medicine

## 2016-05-21 DIAGNOSIS — E039 Hypothyroidism, unspecified: Secondary | ICD-10-CM

## 2016-07-28 ENCOUNTER — Other Ambulatory Visit: Payer: Self-pay | Admitting: Family Medicine

## 2016-07-28 DIAGNOSIS — Z1231 Encounter for screening mammogram for malignant neoplasm of breast: Secondary | ICD-10-CM

## 2016-08-04 ENCOUNTER — Ambulatory Visit: Payer: BC Managed Care – PPO

## 2016-08-12 ENCOUNTER — Ambulatory Visit
Admission: RE | Admit: 2016-08-12 | Discharge: 2016-08-12 | Disposition: A | Discharge status: Home / Self Care | Payer: BC Managed Care – PPO | Source: Ambulatory Visit | Attending: Family Medicine | Admitting: Family Medicine

## 2016-08-12 DIAGNOSIS — Z1231 Encounter for screening mammogram for malignant neoplasm of breast: Secondary | ICD-10-CM

## 2016-11-10 ENCOUNTER — Other Ambulatory Visit: Payer: Self-pay | Admitting: Family Medicine

## 2016-11-10 DIAGNOSIS — I10 Essential (primary) hypertension: Secondary | ICD-10-CM

## 2016-11-12 NOTE — Telephone Encounter (Signed)
05/2016 last ov and labs 

## 2016-11-19 ENCOUNTER — Ambulatory Visit (INDEPENDENT_AMBULATORY_CARE_PROVIDER_SITE_OTHER): Payer: BC Managed Care – PPO | Admitting: Family Medicine

## 2016-11-19 VITALS — BP 124/80 | HR 56 | Temp 98.7°F | Resp 17 | Ht 66.0 in | Wt 202.0 lb

## 2016-11-19 DIAGNOSIS — I10 Essential (primary) hypertension: Secondary | ICD-10-CM | POA: Diagnosis not present

## 2016-11-19 DIAGNOSIS — E039 Hypothyroidism, unspecified: Secondary | ICD-10-CM

## 2016-11-19 DIAGNOSIS — R2689 Other abnormalities of gait and mobility: Secondary | ICD-10-CM

## 2016-11-19 DIAGNOSIS — E785 Hyperlipidemia, unspecified: Secondary | ICD-10-CM

## 2016-11-19 MED ORDER — LEVOTHYROXINE SODIUM 88 MCG PO TABS: 88.0000 ug | ORAL_TABLET | Freq: Every day | ORAL | 1 refills | Status: DC

## 2016-11-19 NOTE — Progress Notes (Signed)
By signing my name below I, Tereasa Coop, attest that this documentation has been prepared under the direction and in the presence of Wendie Agreste, MD. Electonically Signed. Tereasa Coop, Scribe 11/19/2016 at 5:54 PM  Subjective:    Patient ID: Heidi Soto, female    DOB: August 27, 1971, 45 y.o.   MRN: [id number]  Chief Complaint  Patient presents with  . Follow-up    medication check, labs, loss of balance     HPI Heidi Soto is a 45 y.o. female who presents to the [facility] for follow up reevaluation of hypothyroidism, HTN, and HLD. Pt last seen on 05/19/16. Pt also c/o loss of balance for the past 2 weeks. States that she is occasionally stumbling, feeling unsteady, and dropping things. Reports that when she drops stuff she doesn't realize it at first or feel or notice that she dropped it. Pt reports having one episode of nausea while working out within the past 2 weeks. Pt also reports feeling more cold than usual for the past few months. Denies any unexplained weight loss. Pt believes that she is having difficultly loosing weight despite exercising regularly and eating better. Pt denies dry hair or hair loss. Pt reports dry skin.  Pt denies dizziness, lightheadedness, CP, palpitations, weakness in the arms or legs, or SOB. Pt reports she had a mild HA today. States that today's HA has been her first HA in a long time and no different than previous HAs.   Hypothyroidism  Lab Results  Component Value Date   TSH 9.18 (H) 05/19/2016   Pt takes 88 levothyroxine QD. Pt was advised at last visit to follow up for repeat TSH in 6 weeks as pt had not been taking medication at time of visit. Pt did not return to have that testing done. Pt states today that she has been taking her thyroid medication faithfully.   HLD Lab Results  Component Value Date   CHOL 194 05/19/2016   HDL 68 05/19/2016   LDLCALC 110 05/19/2016   TRIG 82 05/19/2016   CHOLHDL 2.9 05/19/2016     Lab Results  Component Value Date   ALT 16 05/19/2016   AST 20 05/19/2016   ALKPHOS 75 05/19/2016   BILITOT 0.5 05/19/2016  Pt states that she has fasted all day today.   HTN Lab Results  Component Value Date   CREATININE 0.81 05/19/2016  Pt takes 12.5 mg HCTZ QD. Pt does not regularly check her BP.    Pt reports that she is eating better and exercising regularly.   Patient Active Problem List   Diagnosis Date Noted  . Obesity, unspecified 03/13/2014  . S/P abdominal hysterectomy 06/30/2013  . H/O Graves' disease 12/30/2012  . Fibroids 12/30/2012   Past Medical History:  Diagnosis Date  . Graves disease   . Headache(784.0)   . Hypertension   . Thyroid disease    Past Surgical History:  Procedure Laterality Date  . ABDOMINAL HYSTERECTOMY N/A 06/29/2013   Procedure: HYSTERECTOMY ABDOMINAL;  Surgeon: Elveria Royals, MD;  Location: [place] [facility];  Service: Gynecology;  Laterality: N/A;  . BILATERAL SALPINGECTOMY Bilateral 06/29/2013   Procedure: BILATERAL SALPINGECTOMY;  Surgeon: Elveria Royals, MD;  Location: [place] [facility];  Service: Gynecology;  Laterality: Bilateral;  . NO PAST SURGERIES     Allergies  Allergen Reactions  . Bactroban [Mupirocin Calcium] Rash   Prior to Admission medications   Medication Sig Start Date End Date Taking? Authorizing Provider  hydrochlorothiazide (HYDRODIURIL) 25 MG tablet TAKE 1/2 TABLET (12.5MG     TOTAL) DAILY 11/12/16  Yes Wendie Agreste, MD  levothyroxine (SYNTHROID, LEVOTHROID) 88 MCG tablet Take 1 tablet (88 mcg total) by mouth daily before breakfast. 05/19/16  Yes Wendie Agreste, MD  Multiple Vitamin (MULTIVITAMIN WITH MINERALS) TABS Take 1 tablet by mouth daily. Reported on 05/19/2016   Yes Historical Provider, MD   Social History   Social History  . Marital status: Single    Spouse name: N/A  . Number of children: N/A  . Years of education: N/A   Occupational History  . Not on file.   Social History Main Topics  . Smoking  status: Never Smoker  . Smokeless tobacco: Not on file  . Alcohol use No  . Drug use: No  . Sexual activity: Not on file   Other Topics Concern  . Not on file   Social History Narrative  . No narrative on file       Review of Systems  Constitutional: Negative for fatigue and unexpected weight change.  Respiratory: Negative for chest tightness and shortness of breath.   Cardiovascular: Negative for chest pain, palpitations and leg swelling.  Gastrointestinal: Negative for abdominal pain and blood in stool.  Skin:       Positive for dry skin  Neurological: Positive for headaches (mild). Negative for dizziness, syncope and light-headedness.       Positive for loss of balance.       Objective:   Physical Exam  Constitutional: She is oriented to person, place, and time. She appears well-developed and well-nourished.  HENT:  Head: Normocephalic and atraumatic.  Eyes: Conjunctivae and EOM are normal. Pupils are equal, round, and reactive to light.  Neck: Carotid bruit is not present. No thyroid mass and no thyromegaly (no nodules) present.  Cardiovascular: Normal rate, regular rhythm, normal heart sounds and intact distal pulses.  Exam reveals no gallop and no friction rub.   No murmur heard. Pulmonary/Chest: Effort normal and breath sounds normal.  Abdominal: Soft. She exhibits no pulsatile midline mass. There is no tenderness.  Neurological: She is alert and oriented to person, place, and time. She has normal strength. She displays a negative Romberg sign.  No pronator drift Normal finger to nose. Heel to toe walk normal. Heel to shin slide normal.  RAM normal.  Non-focal neuro exam.  Upper extremity strength full and equal. No facial droop.  Skin: Skin is warm and dry.  Psychiatric: She has a normal mood and affect. Her behavior is normal.  Vitals reviewed.    Vitals:   11/19/16 1641  BP: 124/80  Pulse: (!) 56  Resp: 17  Temp: 98.7 F (37.1 C)  TempSrc: Oral   SpO2: 100%  Weight: 202 lb (91.6 kg)  Height: 5\' 6"  (1.676 m)        Assessment & Plan:   Heidi Soto is a 45 y.o. female Hypothyroidism, unspecified type - Plan: TSH, levothyroxine (SYNTHROID, LEVOTHROID) 88 MCG tablet  - Check TSH, continue same dose of Synthroid for now. She is describing some hypothyroid symptoms, so may need a dose adjustment.  Essential hypertension - Plan: Comprehensive metabolic panel  -Stable, no medication changes for now. Check CMP.  Hyperlipidemia, unspecified hyperlipidemia type - Plan: Comprehensive metabolic panel, Lipid panel  -Check lipids, CMP. Continue diet and exercise.  Episodic brief balance difficulty. No focal neurologic findings on exam, and cerebellar testing appears normal. Intermittent symptoms overall reassuring. Check TSH as above,  maintain hydration, maintain sufficient calories throughout the day. Then if symptoms persist, consider neurology eval. ER/RTC precautions if acutely worsening.   Meds ordered this encounter  Medications  . levothyroxine (SYNTHROID, LEVOTHROID) 88 MCG tablet    Sig: Take 1 tablet (88 mcg total) by mouth daily before breakfast.    Dispense:  90 tablet    Refill:  1   Patient Instructions    I will check your thyroid level, and if that is abnormal, will change your medication.  If thyroid test is normal and you still experience these balance issues, I would like you to meet with a neurologist and can refer you to their office. If any acute worsening or more frequent symptoms, return here or the emergency room. Make sure you are obtaining sufficient calories throughout the day as well as drinking sufficient fluids throughout the day  Return to the clinic or go to the nearest emergency room if any of your symptoms worsen or new symptoms occur.  Schedule a physical with me at your convenience.  IF you received an x-ray today, you will receive an invoice from [facility] Radiology. Please contact [facility]  Radiology at [phone] with questions or concerns regarding your invoice.   IF you received labwork today, you will receive an invoice from [employer]. Please contact [facility] at [phone] with questions or concerns regarding your invoice.   Our billing staff will not be able to assist you with questions regarding bills from these companies.  You will be contacted with the lab results as soon as they are available. The fastest way to get your results is to activate your My Chart account. Instructions are located on the last page of this paperwork. If you have not heard from [place] regarding the results in 2 weeks, please contact this office.        I personally performed the services described in this documentation, which was scribed in my presence. The recorded information has been reviewed and considered, and addended by me as needed.   Signed,   Merri Ray, MD [facility].  11/21/16 1:44 PM

## 2016-11-19 NOTE — Patient Instructions (Addendum)
  I will check your thyroid level, and if that is abnormal, will change your medication.  If thyroid test is normal and you still experience these balance issues, I would like you to meet with a neurologist and can refer you to their office. If any acute worsening or more frequent symptoms, return here or the emergency room. Make sure you are obtaining sufficient calories throughout the day as well as drinking sufficient fluids throughout the day  Return to the clinic or go to the nearest emergency room if any of your symptoms worsen or new symptoms occur.  Schedule a physical with me at your convenience.  IF you received an x-ray today, you will receive an invoice from [facility] Radiology. Please contact [facility] Radiology at [phone] with questions or concerns regarding your invoice.   IF you received labwork today, you will receive an invoice from [employer]. Please contact [facility] at [phone] with questions or concerns regarding your invoice.   Our billing staff will not be able to assist you with questions regarding bills from these companies.  You will be contacted with the lab results as soon as they are available. The fastest way to get your results is to activate your My Chart account. Instructions are located on the last page of this paperwork. If you have not heard from [place] regarding the results in 2 weeks, please contact this office.

## 2016-11-20 LAB — SPECIMEN STATUS

## 2016-11-21 LAB — COMPREHENSIVE METABOLIC PANEL
A/G RATIO: 1.7 (ref 1.2–2.2)
ALT: 24 IU/L (ref 0–32)
AST: 27 IU/L (ref 0–40)
Albumin: 4.3 g/dL (ref 3.5–5.5)
Alkaline Phosphatase: 53 IU/L (ref 39–117)
BUN/Creatinine Ratio: 17 (ref 9–23)
BUN: 14 mg/dL (ref 6–24)
Bilirubin Total: 0.8 mg/dL (ref 0.0–1.2)
CO2: 28 mmol/L (ref 18–29)
Calcium: 9.6 mg/dL (ref 8.7–10.2)
Chloride: 98 mmol/L (ref 96–106)
Creatinine, Ser: 0.82 mg/dL (ref 0.57–1.00)
GFR calc Af Amer: 101 mL/min/{1.73_m2} (ref >59)
GFR calc non Af Amer: 87 mL/min/{1.73_m2} (ref >59)
Globulin, Total: 2.5 g/dL (ref 1.5–4.5)
Glucose: 71 mg/dL (ref 65–99)
POTASSIUM: 4.7 mmol/L (ref 3.5–5.2)
Sodium: 142 mmol/L (ref 134–144)
Total Protein: 6.8 g/dL (ref 6.0–8.5)

## 2016-11-21 LAB — LIPID PANEL
CHOLESTEROL TOTAL: 180 mg/dL (ref 100–199)
Chol/HDL Ratio: 3.3 ratio units (ref 0.0–4.4)
HDL: 55 mg/dL (ref >39)
LDL Calculated: 116 mg/dL — ABNORMAL HIGH (ref 0–99)
TRIGLYCERIDES: 43 mg/dL (ref 0–149)
VLDL Cholesterol Cal: 9 mg/dL (ref 5–40)

## 2016-11-21 LAB — TSH: TSH: 3.74 u[IU]/mL (ref 0.450–4.500)

## 2016-11-26 ENCOUNTER — Telehealth: Payer: Self-pay | Admitting: Family Medicine

## 2016-11-26 NOTE — Telephone Encounter (Signed)
Pt calling about lab results  

## 2016-12-04 NOTE — Telephone Encounter (Signed)
Labs looked okay. I sent her a mychart message on the 13th.  Her original phone call appears to be on the 13th, so should have received results after that call. Let me know if there are other questions.

## 2016-12-04 NOTE — Telephone Encounter (Signed)
Looks nl , ok to result to pt?

## 2016-12-05 NOTE — Telephone Encounter (Signed)
lmtcb with any questions

## 2017-01-30 ENCOUNTER — Other Ambulatory Visit: Payer: Self-pay | Admitting: Family Medicine

## 2017-01-30 DIAGNOSIS — I10 Essential (primary) hypertension: Secondary | ICD-10-CM

## 2017-02-16 ENCOUNTER — Ambulatory Visit (INDEPENDENT_AMBULATORY_CARE_PROVIDER_SITE_OTHER): Payer: BC Managed Care – PPO | Admitting: Family Medicine

## 2017-02-16 VITALS — BP 124/80 | HR 80 | Temp 98.5°F | Resp 18 | Ht 66.0 in | Wt 181.0 lb

## 2017-02-16 DIAGNOSIS — I10 Essential (primary) hypertension: Secondary | ICD-10-CM

## 2017-02-16 DIAGNOSIS — E039 Hypothyroidism, unspecified: Secondary | ICD-10-CM

## 2017-02-16 DIAGNOSIS — I7300 Raynaud's syndrome without gangrene: Secondary | ICD-10-CM | POA: Diagnosis not present

## 2017-02-16 MED ORDER — AMLODIPINE BESYLATE 2.5 MG PO TABS: 2.5000 mg | ORAL_TABLET | Freq: Every day | ORAL | 1 refills | Status: DC

## 2017-02-16 NOTE — Patient Instructions (Addendum)
Stop hydrochlorothiazide, start amlodipine, one per day. No change in other medications for now. I will check thyroid test, and other blood tests for possible autoimmune cause of Raynauds,  but will also refer you to rheumatology for further workup.  Return to the clinic or go to the nearest emergency room if any of your symptoms worsen or new symptoms occur.  Raynaud Phenomenon Raynaud phenomenon is a condition that affects the blood vessels (arteries) that carry blood to your fingers and toes. The arteries that supply blood to your ears or the tip of your nose might also be affected. Raynaud phenomenon causes the arteries to temporarily narrow. As a result, the flow of blood to the affected areas is temporarily decreased. This usually occurs in response to cold temperatures or stress. During an attack, the skin in the affected areas turns white. You may also feel tingling or numbness in those areas. Attacks usually last for only a brief period, and then the blood flow to the area returns to normal. In most cases, Raynaud phenomenon does not cause serious health problems. What are the causes? For many people with this condition, the cause is not known. Raynaud phenomenon is sometimes associated with other diseases, such as scleroderma or lupus. What increases the risk? Raynaud phenomenon can affect anyone, but it develops most often in people who are 57-31 years old. It affects more females than males. What are the signs or symptoms? Symptoms of Raynaud phenomenon may occur when you are exposed to cold temperatures or when you have emotional stress. The symptoms may last for a few minutes or up to several hours. They usually affect your fingers but may also affect your toes, ears, or the tip of your nose. Symptoms may include:  Changes in skin color. The skin in the affected areas will turn pale or white. The skin may then change from white to bluish to red as normal blood flow returns to the  area.  Numbness, tingling, or pain in the affected areas. In severe cases, sores may develop in the affected areas. How is this diagnosed? Your health care provider will do a physical exam and take your medical history. You may be asked to put your hands in cold water to check for a reaction to cold temperature. Blood tests may be done to check for other diseases or conditions. Your health care provider may also order a test to check the movement of blood through your arteries and veins (vascular ultrasound). How is this treated? Treatment often involves making lifestyle changes and taking steps to control your exposure to cold temperatures. For more severe cases, medicine (calcium channel blockers) may be used to improve blood flow. Surgery is sometimes done to block the nerves that control the affected arteries, but this is rare. Follow these instructions at home:  Avoid exposure to cold by taking these steps:  If possible, stay indoors during cold weather.  When you go outside during cold weather, dress in layers and wear mittens, a hat, a scarf, and warm footwear.  Wear mittens or gloves when handling ice or frozen food.  Use holders for glasses or cans containing cold drinks.  Let warm water run for a while before taking a shower or bath.  Warm up the car before driving in cold weather.  If possible, avoid stressful and emotional situations. Exercise, meditation, and yoga may help you cope with stress. Biofeedback may be useful.  Do not use any tobacco products, including cigarettes, chewing tobacco, or electronic  cigarettes. If you need help quitting, ask your health care provider.  Avoid secondhand smoke.  Limit your use of caffeine. Switch to decaffeinated coffee, tea, and soda. Avoid chocolate.  Wear loose fitting socks and comfortable, roomy shoes.  Avoid vibrating tools and machinery.  Take medicines only as directed by your health care provider. Contact a health care  provider if:  Your discomfort becomes worse despite lifestyle changes.  You develop sores on your fingers or toes that do not heal.  Your fingers or toes turn black.  You have breaks in the skin on your fingers or toes.  You have a fever.  You have pain or swelling in your joints.  You have a rash.  Your symptoms occur on only one side of your body. This information is not intended to replace advice given to you by your health care provider. Make sure you discuss any questions you have with your health care provider. Document Released: 11/28/2000 Document Revised: 05/08/2016 Document Reviewed: 06/04/2016 Elsevier Interactive Patient Education  2017 [employer].   IF you received an x-ray today, you will receive an invoice from [facility]. Please contact [facility] at [phone] with questions or concerns regarding your invoice.   IF you received labwork today, you will receive an invoice from [facility]. Please contact [facility] at [phone] with questions or concerns regarding your invoice.   Our billing staff will not be able to assist you with questions regarding bills from these companies.  You will be contacted with the lab results as soon as they are available. The fastest way to get your results is to activate your My Chart account. Instructions are located on the last page of this paperwork. If you have not heard from [place] regarding the results in 2 weeks, please contact this office.

## 2017-02-16 NOTE — Progress Notes (Signed)
Subjective:  By signing my name below, I, Essence Howell, attest that this documentation has been prepared under the direction and in the presence of Wendie Agreste, MD Electronically Signed: Ladene Artist, ED Scribe 02/16/2017 at 5:57 PM.    Patient ID: Heidi Soto, female    DOB: 1971/06/03, 46 y.o.   MRN: [id number]  Chief Complaint  Patient presents with  . Numbness    in fingertips   HPI Heidi Soto is a 46 y.o. female who presents to Primary Care at [facility]. Last office visit was in December  for hypothyroidism.   Lab Results  Component Value Date   TSH 3.740 11/19/2016   Pt was having complaints of unsteadiness at last visit. Mild HA at that visit. Had a non-focal neuro exam with normal strength, normal gait. Advised at any return of balance issues, recommend neuro eval. She had a normal CMP. She is still taking 88 microgram of synthroid. States she has changed her eating habits and intentionally lost weight.   Wt Readings from Last 3 Encounters:  02/16/17 181 lb (82.1 kg)  11/19/16 202 lb (91.6 kg)  05/19/16 217 lb (98.4 kg)   Numbness  Today, pt presents with intermittent numbness in her fingertips. Pt states that numbness only occurs when she holds a cold glass, is in the cold section of the grocery store or gets cold and resolves after approximately 15 minutes. She has noticed that her fingertips become pale during these times as well. Pt states that her fingertips then turn bright red.   Patient Active Problem List   Diagnosis Date Noted  . Obesity, unspecified 03/13/2014  . S/P abdominal hysterectomy 06/30/2013  . H/O Graves' disease 12/30/2012  . Fibroids 12/30/2012   Past Medical History:  Diagnosis Date  . Graves disease   . Headache(784.0)   . Hypertension   . Thyroid disease    Past Surgical History:  Procedure Laterality Date  . ABDOMINAL HYSTERECTOMY N/A 06/29/2013   Procedure: HYSTERECTOMY ABDOMINAL;  Surgeon: Elveria Royals, MD;  Location:  [facility];  Service: Gynecology;  Laterality: N/A;  . BILATERAL SALPINGECTOMY Bilateral 06/29/2013   Procedure: BILATERAL SALPINGECTOMY;  Surgeon: Elveria Royals, MD;  Location: [facility];  Service: Gynecology;  Laterality: Bilateral;  . NO PAST SURGERIES     Allergies  Allergen Reactions  . Bactroban [Mupirocin Calcium] Rash   Prior to Admission medications   Medication Sig Start Date End Date Taking? Authorizing Provider  hydrochlorothiazide (HYDRODIURIL) 25 MG tablet 1/2 tab daily 01/30/17  Yes Wendie Agreste, MD  levothyroxine (SYNTHROID, LEVOTHROID) 88 MCG tablet Take 1 tablet (88 mcg total) by mouth daily before breakfast. 11/19/16  Yes Wendie Agreste, MD  Multiple Vitamin (MULTIVITAMIN WITH MINERALS) TABS Take 1 tablet by mouth daily. Reported on 05/19/2016   Yes Historical Provider, MD   Social History   Social History  . Marital status: Single    Spouse name: N/A  . Number of children: N/A  . Years of education: N/A   Occupational History  . Not on file.   Social History Main Topics  . Smoking status: Never Smoker  . Smokeless tobacco: Never Used  . Alcohol use No  . Drug use: No  . Sexual activity: Not on file   Other Topics Concern  . Not on file   Social History Narrative  . No narrative on file   Review of Systems  Skin: Positive for color change and pallor.  Neurological: Positive for  numbness.      Objective:   Physical Exam  Constitutional: She is oriented to person, place, and time. She appears well-developed and well-nourished. No distress.  HENT:  Head: Normocephalic and atraumatic.  Eyes: Conjunctivae and EOM are normal.  Neck: Neck supple. No tracheal deviation present.  Cardiovascular: Normal rate.   Pulses:      Radial pulses are 2+ on the right side, and 2+ on the left side.  Pulmonary/Chest: Effort normal. No respiratory distress.  Musculoskeletal: Normal range of motion.  Neurological: She is alert and oriented to person, place, and time.    Skin: Skin is warm and dry.  Has a picture with the second digit on the R with pallor from fingertip to PIP. Other photo shows pallor of the 2nd distal phalanx and 3rd and 4th phalanx from fingertip to PIP on the L hand. Cap refill 1- 2 seconds.  Psychiatric: She has a normal mood and affect. Her behavior is normal.  Nursing note and vitals reviewed.  Vitals:   02/16/17 1655  BP: 124/80  Pulse: 80  Resp: 18  Temp: 98.5 F (36.9 C)  TempSrc: Oral  SpO2: 100%  Weight: 181 lb (82.1 kg)  Height: 5\' 6"  (1.676 m)      Assessment & Plan:   Heidi Soto is a 46 y.o. female Raynaud's disease without gangrene - Plan: ANA w/Reflex if Positive, amLODipine (NORVASC) 2.5 MG tablet, Ambulatory referral to Rheumatology  - Change antihypertensive to low-dose amlodipine as that may be helpful for symptoms. Avoid extreme cold temperature to digits for now, check ANA and referred to rheumatology for evaluation of secondary causes of Raynauds.  . Hypothyroidism, unspecified type - Plan: TSH  - Repeat TSH with above symptoms, but previously controlled.   Essential hypertension - Plan: amLODipine (NORVASC) 2.5 MG tablet  - Stable, will change to low-dose Norvasc as above. RTC precautions if side effects at this dose.  Meds ordered this encounter  Medications  . amLODipine (NORVASC) 2.5 MG tablet    Sig: Take 1 tablet (2.5 mg total) by mouth daily.    Dispense:  90 tablet    Refill:  1   Patient Instructions    Stop hydrochlorothiazide, start amlodipine, one per day. No change in other medications for now. I will check thyroid test, and other blood tests for possible autoimmune cause of Raynauds,  but will also refer you to rheumatology for further workup.  Return to the clinic or go to the nearest emergency room if any of your symptoms worsen or new symptoms occur.  Raynaud Phenomenon Raynaud phenomenon is a condition that affects the blood vessels (arteries) that carry blood to your  fingers and toes. The arteries that supply blood to your ears or the tip of your nose might also be affected. Raynaud phenomenon causes the arteries to temporarily narrow. As a result, the flow of blood to the affected areas is temporarily decreased. This usually occurs in response to cold temperatures or stress. During an attack, the skin in the affected areas turns white. You may also feel tingling or numbness in those areas. Attacks usually last for only a brief period, and then the blood flow to the area returns to normal. In most cases, Raynaud phenomenon does not cause serious health problems. What are the causes? For many people with this condition, the cause is not known. Raynaud phenomenon is sometimes associated with other diseases, such as scleroderma or lupus. What increases the risk? Raynaud phenomenon can affect  anyone, but it develops most often in people who are 76-92 years old. It affects more females than males. What are the signs or symptoms? Symptoms of Raynaud phenomenon may occur when you are exposed to cold temperatures or when you have emotional stress. The symptoms may last for a few minutes or up to several hours. They usually affect your fingers but may also affect your toes, ears, or the tip of your nose. Symptoms may include:  Changes in skin color. The skin in the affected areas will turn pale or white. The skin may then change from white to bluish to red as normal blood flow returns to the area.  Numbness, tingling, or pain in the affected areas. In severe cases, sores may develop in the affected areas. How is this diagnosed? Your health care provider will do a physical exam and take your medical history. You may be asked to put your hands in cold water to check for a reaction to cold temperature. Blood tests may be done to check for other diseases or conditions. Your health care provider may also order a test to check the movement of blood through your arteries and veins  (vascular ultrasound). How is this treated? Treatment often involves making lifestyle changes and taking steps to control your exposure to cold temperatures. For more severe cases, medicine (calcium channel blockers) may be used to improve blood flow. Surgery is sometimes done to block the nerves that control the affected arteries, but this is rare. Follow these instructions at home:  Avoid exposure to cold by taking these steps:  If possible, stay indoors during cold weather.  When you go outside during cold weather, dress in layers and wear mittens, a hat, a scarf, and warm footwear.  Wear mittens or gloves when handling ice or frozen food.  Use holders for glasses or cans containing cold drinks.  Let warm water run for a while before taking a shower or bath.  Warm up the car before driving in cold weather.  If possible, avoid stressful and emotional situations. Exercise, meditation, and yoga may help you cope with stress. Biofeedback may be useful.  Do not use any tobacco products, including cigarettes, chewing tobacco, or electronic cigarettes. If you need help quitting, ask your health care provider.  Avoid secondhand smoke.  Limit your use of caffeine. Switch to decaffeinated coffee, tea, and soda. Avoid chocolate.  Wear loose fitting socks and comfortable, roomy shoes.  Avoid vibrating tools and machinery.  Take medicines only as directed by your health care provider. Contact a health care provider if:  Your discomfort becomes worse despite lifestyle changes.  You develop sores on your fingers or toes that do not heal.  Your fingers or toes turn black.  You have breaks in the skin on your fingers or toes.  You have a fever.  You have pain or swelling in your joints.  You have a rash.  Your symptoms occur on only one side of your body. This information is not intended to replace advice given to you by your health care provider. Make sure you discuss any  questions you have with your health care provider. Document Released: 11/28/2000 Document Revised: 05/08/2016 Document Reviewed: 06/04/2016 Elsevier Interactive Patient Education  2017 [employer].   IF you received an x-ray today, you will receive an invoice from [facility]. Please contact [facility] at [phone] with questions or concerns regarding your invoice.   IF you received labwork today, you will receive an invoice from  [facility]. Please contact [facility] at [phone] with questions or concerns regarding your invoice.   Our billing staff will not be able to assist you with questions regarding bills from these companies.  You will be contacted with the lab results as soon as they are available. The fastest way to get your results is to activate your My Chart account. Instructions are located on the last page of this paperwork. If you have not heard from [place] regarding the results in 2 weeks, please contact this office.     I personally performed the services described in this documentation, which was scribed in my presence. The recorded information has been reviewed and considered for accuracy and completeness, addended by me as needed, and agree with information above.  Signed,   Merri Ray, MD [facility].  02/18/17 12:01 PM

## 2017-02-17 LAB — TSH: TSH: 5.47 u[IU]/mL — ABNORMAL HIGH (ref 0.450–4.500)

## 2017-02-17 LAB — ANA W/REFLEX IF POSITIVE: Anti Nuclear Antibody(ANA): NEGATIVE

## 2017-04-08 ENCOUNTER — Telehealth: Payer: Self-pay | Admitting: Family Medicine

## 2017-04-08 NOTE — Telephone Encounter (Signed)
Pt calling concerning amLODipine 2.5 MG. One of the side affects was swelling and pt has noticed swelling in her ankles within the past week as well as pt can feel tightness. Pt wondering if she could switch back to Hydrochlorothiazide. Best pt callback number is [phone].

## 2017-04-09 NOTE — Telephone Encounter (Signed)
That med can cause some swelling, but usually not at 2.5mg  dose. We started that to help with Rauynauds as well. Would try to continue it for another week or two, elevate legs when seated for now and if still not improving, let me know and we can change back.

## 2017-04-09 NOTE — Telephone Encounter (Signed)
l/m with dr greens note Call us  with update next week

## 2017-07-09 ENCOUNTER — Other Ambulatory Visit: Payer: Self-pay | Admitting: Family Medicine

## 2017-07-09 DIAGNOSIS — E039 Hypothyroidism, unspecified: Secondary | ICD-10-CM

## 2017-07-13 ENCOUNTER — Other Ambulatory Visit: Payer: Self-pay | Admitting: Family Medicine

## 2017-07-13 DIAGNOSIS — Z1231 Encounter for screening mammogram for malignant neoplasm of breast: Secondary | ICD-10-CM

## 2017-07-17 ENCOUNTER — Ambulatory Visit (INDEPENDENT_AMBULATORY_CARE_PROVIDER_SITE_OTHER): Payer: BC Managed Care – PPO | Admitting: Family Medicine

## 2017-07-17 ENCOUNTER — Encounter: Payer: Self-pay | Admitting: Family Medicine

## 2017-07-17 VITALS — BP 131/77 | HR 50 | Temp 98.1°F | Resp 16 | Ht 64.0 in | Wt 178.4 lb

## 2017-07-17 DIAGNOSIS — Z13220 Encounter for screening for lipoid disorders: Secondary | ICD-10-CM | POA: Diagnosis not present

## 2017-07-17 DIAGNOSIS — Z131 Encounter for screening for diabetes mellitus: Secondary | ICD-10-CM | POA: Diagnosis not present

## 2017-07-17 DIAGNOSIS — I10 Essential (primary) hypertension: Secondary | ICD-10-CM | POA: Diagnosis not present

## 2017-07-17 DIAGNOSIS — E039 Hypothyroidism, unspecified: Secondary | ICD-10-CM | POA: Diagnosis not present

## 2017-07-17 DIAGNOSIS — R238 Other skin changes: Secondary | ICD-10-CM | POA: Diagnosis not present

## 2017-07-17 DIAGNOSIS — R001 Bradycardia, unspecified: Secondary | ICD-10-CM | POA: Diagnosis not present

## 2017-07-17 DIAGNOSIS — R233 Spontaneous ecchymoses: Secondary | ICD-10-CM

## 2017-07-17 DIAGNOSIS — R42 Dizziness and giddiness: Secondary | ICD-10-CM | POA: Diagnosis not present

## 2017-07-17 DIAGNOSIS — R011 Cardiac murmur, unspecified: Secondary | ICD-10-CM

## 2017-07-17 DIAGNOSIS — Z0000 Encounter for general adult medical examination without abnormal findings: Secondary | ICD-10-CM

## 2017-07-17 MED ORDER — LEVOTHYROXINE SODIUM 88 MCG PO TABS: 88.0000 ug | ORAL_TABLET | Freq: Every day | ORAL | 1 refills | Status: DC

## 2017-07-17 MED ORDER — HYDROCHLOROTHIAZIDE 12.5 MG PO CAPS: 12.5000 mg | ORAL_CAPSULE | Freq: Every day | ORAL | 1 refills | Status: DC

## 2017-07-17 NOTE — Progress Notes (Signed)
Subjective:  By signing my name below, I, Moises Blood, attest that this documentation has been prepared under the direction and in the presence of Merri Ray, MD. Electronically Signed: Moises Blood, [place]. 07/17/2017 , 12:04 PM .  Patient was seen in Room 11 .   Patient ID: Heidi Soto, female    DOB: 1971-07-21, 46 y.o.   MRN: [id number] Chief Complaint  Patient presents with  . Annual Exam    pt is going to have pap and mammgram done, she will have results sent here.   HPI  Heidi Soto is a 46 y.o. female Here for annual physical. She has a history of hypothyroidism, Raynaud's, and HTN.   Hypothyroidism She takes synthroid 88 mcg QD.   Lab Results  Component Value Date   TSH 5.470 (H) 02/16/2017   Her TSH was borderline when last checked 5 months ago; recommended repeat testing in 6 weeks. She has not had testing done since March.   Bruising easily She's noticed bruising more easily when working out. She was leaning down on her leg while working out a few weeks ago, but would subside. She denies usual bleeding in gums when brushing her teeth. She denies hematuria, unexpected weight loss, or night sweats.   Cold intolerant She's been feeling more cold intolerant recently.   Wt Readings from Last 3 Encounters:  07/17/17 178 lb 6.4 oz (80.9 kg)  02/16/17 181 lb (82.1 kg)  11/19/16 202 lb (91.6 kg)    HTN Her HCTZ was switched to Norvasc, but did have swelling, so switched back to HCTZ 12.5 mg QD.   Lightheadedness She's noticed more lightheadedness, unsure if due to positional changes too fast, from sitting to standing too quickly. She denies palpitation or chest pain.   She informs checking her BP at her parents' home and her systolic was low at 88.   Joint pain She mentions right shoulder feeling sore.   Cancer Screening Breast cancer screening: Last mammogram done in Aug 2017, scheduled for Aug 31st this year; no concerning findings last year.    Cervical cancer screening: History of hysterectomy due to fibroids in 2014.   Immunizations Immunization History  Administered Date(s) Administered  . Influenza, Seasonal, Injecte, Preservative Fre 12/30/2012  . Tdap 07/16/2013    Depression Depression screen [facility] 2/9 07/17/2017 02/16/2017 11/19/2016 05/19/2016 06/08/2015  Decreased Interest 0 0 0 0 0  Down, Depressed, Hopeless 0 0 0 0 0  PHQ - 2 Score 0 0 0 0 0    Vision  Visual Acuity Screening   Right eye Left eye Both eyes  Without correction:     With correction: 20/20 20/20 20/20    She hasn't seen eye doctor recently.   Dentist She last saw dentist yesterday for cleaning.   Exercise She exercises daily, and sometimes twice a day.   Patient Active Problem List   Diagnosis Date Noted  . Obesity, unspecified 03/13/2014  . S/P abdominal hysterectomy 06/30/2013  . H/O Graves' disease 12/30/2012  . Fibroids 12/30/2012   Past Medical History:  Diagnosis Date  . Graves disease   . Headache(784.0)   . Hypertension   . Thyroid disease    Past Surgical History:  Procedure Laterality Date  . ABDOMINAL HYSTERECTOMY N/A 06/29/2013   Procedure: HYSTERECTOMY ABDOMINAL;  Surgeon: Elveria Royals, MD;  Location: [facility];  Service: Gynecology;  Laterality: N/A;  . BILATERAL SALPINGECTOMY Bilateral 06/29/2013   Procedure: BILATERAL SALPINGECTOMY;  Surgeon: Elveria Royals, MD;  Location: [facility];  Service: Gynecology;  Laterality: Bilateral;  . NO PAST SURGERIES     Allergies  Allergen Reactions  . Bactroban [Mupirocin Calcium] Rash   Prior to Admission medications   Medication Sig Start Date End Date Taking? Authorizing Provider  amLODipine (NORVASC) 2.5 MG tablet Take 1 tablet (2.5 mg total) by mouth daily. 02/16/17   Wendie Agreste, MD  levothyroxine (SYNTHROID, LEVOTHROID) 88 MCG tablet TAKE 1 TABLET DAILY        BEFORE BREAKFAST 07/10/17   Wendie Agreste, MD  Multiple Vitamin (MULTIVITAMIN WITH MINERALS) TABS Take 1 tablet  by mouth daily. Reported on 05/19/2016    [provider]   Social History   Social History  . Marital status: Single    Spouse name: N/A  . Number of children: N/A  . Years of education: N/A   Occupational History  . Not on file.   Social History Main Topics  . Smoking status: Never Smoker  . Smokeless tobacco: Never Used  . Alcohol use No     Comment: "not often"  . Drug use: No  . Sexual activity: Yes    Partners: Male   Other Topics Concern  . Not on file   Social History Narrative  . No narrative on file   Review of Systems  Constitutional: Negative for chills and diaphoresis.  HENT: Negative for dental problem.   Cardiovascular: Negative for chest pain and palpitations.  Endocrine: Positive for cold intolerance.  Genitourinary: Negative for hematuria.  Musculoskeletal: Positive for arthralgias.  Neurological: Positive for light-headedness.  Hematological: Bruises/bleeds easily.       Objective:   Physical Exam  Constitutional: She is oriented to person, place, and time. She appears well-developed and well-nourished.  HENT:  Head: Normocephalic and atraumatic.  Right Ear: External ear normal.  Left Ear: External ear normal.  Mouth/Throat: Oropharynx is clear and moist.  Eyes: Pupils are equal, round, and reactive to light. Conjunctivae are normal.  Neck: Normal range of motion. Neck supple. No thyromegaly present.  Cardiovascular: Regular rhythm and intact distal pulses.  Bradycardia present.   Murmur heard.  Systolic murmur is present with a grade of 2/6  Pulmonary/Chest: Effort normal and breath sounds normal. No respiratory distress. She has no wheezes.  Abdominal: Soft. Bowel sounds are normal. There is no tenderness.  Musculoskeletal: Normal range of motion. She exhibits no edema or tenderness.  Lymphadenopathy:    She has no cervical adenopathy.  Neurological: She is alert and oriented to person, place, and time.  Skin: Skin is warm and  dry. No rash noted.  Psychiatric: She has a normal mood and affect. Her behavior is normal. Thought content normal.  Vitals reviewed.   Vitals:   07/17/17 1111  BP: 131/77  Pulse: (!) 50  Resp: 16  Temp: 98.1 F (36.7 C)  TempSrc: Oral  SpO2: 99%  Weight: 178 lb 6.4 oz (80.9 kg)  Height: 5\' 4"  (1.626 m)   EKG: sinus rhythm bradycardia, possible poor R-wave progression; no acute findings    Assessment & Plan:     GALADRIEL SHROFF is a 46 y.o. female Annual physical exam  - -anticipatory guidance as below in AVS, screening labs above. Health maintenance items as above in HPI discussed/recommended as applicable.   Hypothyroidism, unspecified type - Plan: TSH + free T4, levothyroxine (SYNTHROID, LEVOTHROID) 88 MCG tablet  - Repeat TSH, free T4, previously borderline levels. Continue same dose of Synthroid for now.  Essential hypertension -  Plan: Comprehensive metabolic panel, ECHOCARDIOGRAM COMPLETE, hydrochlorothiazide (MICROZIDE) 12.5 MG capsule  -Stable, continue HCTZ at same dose.  Tried calcium channel blocker with history of Raynaud's syndrome, but did not tolerate due to swelling.  Easy bruising - Plan: CBC  -Check CBC, but no concerning findings on exam. Denies recurrent bleeding gums   Screening for diabetes mellitus  -Check CMP  Screening for hyperlipidemia - Plan: Lipid panel  Episodic lightheadedness - Plan: CBC, EKG 12-Lead, ECHOCARDIOGRAM COMPLETE Bradycardia - Plan: EKG 12-Lead Heart murmur - Plan: ECHOCARDIOGRAM COMPLETE  -Bradycardic, slightly lower rate than in past. Faint heart murmur was appreciated. Check echocardiogram, CBC, follow-up to be determined by those studies. Consider cardiology eval if persistent lightheadedness in the setting of bradycardia.  Meds ordered this encounter  Medications  . DISCONTD: hydrochlorothiazide (MICROZIDE) 12.5 MG capsule    Sig: Take 12.5 mg by mouth daily.  . hydrochlorothiazide (MICROZIDE) 12.5 MG capsule    Sig:  Take 1 capsule (12.5 mg total) by mouth daily.    Dispense:  90 capsule    Refill:  1  . levothyroxine (SYNTHROID, LEVOTHROID) 88 MCG tablet    Sig: Take 1 tablet (88 mcg total) by mouth daily before breakfast.    Dispense:  90 tablet    Refill:  1   Patient Instructions    If frequent bruising is noted, please return to discuss further. I will check your blood counts today.  I will check blood counts for lightheadedness as well, but please return if that persists.  Keep a record of your blood pressures outside of the office and bring them to the next office visit.  If running low, then will need to change meds. Follow up to discuss further if that occurs. Make sure you drink plenty of fluids, especially when exercising.   I will check thyroid levels today to see if dose changes needed.   I will schedule an ultrasound to evaluate the heart murmur further. Please follow-up within the next 3-4 weeks to discuss the results of these tests and your dizziness symptoms further. Follow-up sooner if worse.  Return to the clinic or go to the nearest emergency room if any of your symptoms worsen or new symptoms occur.   Heart Murmur A heart murmur is an extra sound that is caused by chaotic blood flow. The murmur can be heard as a "hum" or "whoosh" sound when blood flows through the heart. The heart has four areas called chambers. Valves separate the upper and lower chambers from each other (tricuspid valve and mitral valve) and separate the lower chambers of the heart from pathways that lead away from the heart (aortic valve and pulmonary valve). Normally, the valves open to let blood flow through or out of your heart, and then they shut to keep the blood from flowing backward. There are two types of heart murmurs:  Innocent murmurs. Most people with this type of heart murmur do not have a heart problem. Many children have innocent heart murmurs. Your health care provider may suggest some basic  testing to find out whether your murmur is an innocent murmur. If an innocent heart murmur is found, there is no need for further tests or treatment and no need to restrict activities or stop playing sports.  Abnormal murmurs. These types of murmurs can occur in children and adults. Abnormal murmurs may be a sign of a more serious heart condition, such as a heart defect present at birth (congenital defect) or heart valve disease.  What are the causes? This condition is caused by heart valves that are not working properly. In children, abnormal heart murmurs are typically caused by congenital defects. In adults, abnormal murmurs are usually from heart valve problems caused by disease, infection, or aging. Three types of heart valve defects can cause a murmur:  Regurgitation. This is when blood leaks back through the valve in the wrong direction.  Mitral valve prolapse. This is when the mitral valve of the heart has a loose flap and does not close tightly.  Stenosis. This is when a valve does not open enough and blocks blood flow.  This condition may also be caused by:  Pregnancy.  Fever.  Overactive thyroid gland.  Anemia.  Exercise.  Rapid growth spurts (in children).  What are the signs or symptoms? Innocent murmurs do not cause symptoms, and many people with abnormal murmurs may or may not have symptoms. If symptoms do develop, they may include:  Shortness of breath.  Blue coloring of the skin, especially on the fingertips.  Chest pain.  Palpitations, or feeling a fluttering or skipped heartbeat.  Fainting.  Persistent cough.  Getting tired much faster than expected.  Swelling in the abdomen, feet, or ankles.  How is this diagnosed? This condition may be diagnosed during a routine physical or other exam. If your health care provider hears a murmur with a stethoscope, he or she will listen for:  Where the murmur is located in your heart.  How long the murmur lasts  (duration).  When the murmur is heard during the heartbeat.  How loud the murmur is. This may help the health care provider figure out what is causing the murmur.  You may be referred to a heart specialist (cardiologist). You may also have other tests, including:  Electrocardiogram (ECG or EKG). This test measures the electrical activity of your heart.  Echocardiogram. This test uses high frequency sound waves to make pictures of your heart.  MRI or chest X-ray.  Cardiac catheterization. This test looks at blood flow through the heart.  For children and adults who have an abnormal heart murmur and want to stay active, it is important to complete testing, review test results, and receive recommendations from your health care provider. If heart disease is present, it may not be safe to play or be active. How is this treated? Heart murmurs themselves do not need treatment. In some cases, a heart murmur may go away on its own. If an underlying problem or disease is causing the murmur, you may need treatment. If treatment is needed, it will depend on the type and severity of the disease or heart problem causing the murmur. Treatment may include:  Medicine.  Surgery.  Dietary and lifestyle changes.  Follow these instructions at home:  Talk with your health care provider before participating in sports or other activities that require a lot of effort and energy (are strenuous).  Learn as much as possible about your condition and any related diseases. Ask your health care provider if you may at risk for any medical emergencies.  Talk with your health care provider about what symptoms you should look out for.  It is up to you to get your test results. Ask your health care provider, or the department that is doing the test, when your results will be ready.  Keep all follow-up visits as told by your health care provider. This is important. Contact a health care provider if:  You feel  light-headed.  You  are frequently short of breath.  You feel more tired than usual.  You are having a hard time keeping up with normal activities or fitness routines.  You have swelling in your ankles or feet.  You have chest pain.  You notice that your heart often beats irregularly.  You develop any new symptoms. Get help right away if:  You develop severe chest pain.  You are having trouble breathing.  You have fainting spells.  Your symptoms suddenly get worse. These symptoms may represent a serious problem that is an emergency. Do not wait to see if the symptoms will go away. Get medical help right away. Call your local emergency services (911 in the [place] Do not drive yourself to the hospital. Summary  Normally, the heart valves open to let blood flow through or out of your heart, and then they shut to keep the blood from flowing backward.  Heart murmur is caused by heart valves that are not working properly.  You may need treatment if an underlying problem or disease is causing the heart murmur. Treatment may include medicine, surgery, or dietary and lifestyle changes.  Talk with your health care provider before participating in sports or other activities that require a lot of effort and energy (are strenuous).  Talk with your health care provider about what symptoms you should watch out for. This information is not intended to replace advice given to you by your health care provider. Make sure you discuss any questions you have with your health care provider. Document Released: 01/08/2005 Document Revised: 11/19/2016 Document Reviewed: 11/19/2016 Elsevier Interactive Patient Education  2017 [employer]    Dizziness Dizziness is a common problem. It is a feeling of unsteadiness or light-headedness. You may feel like you are about to faint. Dizziness can lead to injury if you stumble or fall. Anyone can become dizzy, but dizziness is more common in older adults.  This condition can be caused by a number of things, including medicines, dehydration, or illness. Follow these instructions at home: Taking these steps may help with your condition: Eating and drinking  Drink enough fluid to keep your urine clear or pale yellow. This helps to keep you from becoming dehydrated. Try to drink more clear fluids, such as water.  Do not drink alcohol.  Limit your caffeine intake if directed by your health care provider.  Limit your salt intake if directed by your health care provider. Activity  Avoid making quick movements. ? Rise slowly from chairs and steady yourself until you feel okay. ? In the morning, first sit up on the side of the bed. When you feel okay, stand slowly while you hold onto something until you know that your balance is fine.  Move your legs often if you need to stand in one place for a long time. Tighten and relax your muscles in your legs while you are standing.  Do not drive or operate heavy machinery if you feel dizzy.  Avoid bending down if you feel dizzy. Place items in your home so that they are easy for you to reach without leaning over. Lifestyle  Do not use any tobacco products, including cigarettes, chewing tobacco, or electronic cigarettes. If you need help quitting, ask your health care provider.  Try to reduce your stress level, such as with yoga or meditation. Talk with your health care provider if you need help. General instructions  Watch your dizziness for any changes.  Take medicines only as directed by your health care  provider. Talk with your health care provider if you think that your dizziness is caused by a medicine that you are taking.  Tell a friend or a family member that you are feeling dizzy. If he or she notices any changes in your behavior, have this person call your health care provider.  Keep all follow-up visits as directed by your health care provider. This is important. Contact a health care  provider if:  Your dizziness does not go away.  Your dizziness or light-headedness gets worse.  You feel nauseous.  You have reduced hearing.  You have new symptoms.  You are unsteady on your feet or you feel like the room is spinning. Get help right away if:  You vomit or have diarrhea and are unable to eat or drink anything.  You have problems talking, walking, swallowing, or using your arms, hands, or legs.  You feel generally weak.  You are not thinking clearly or you have trouble forming sentences. It may take a friend or family member to notice this.  You have chest pain, abdominal pain, shortness of breath, or sweating.  Your vision changes.  You notice any bleeding.  You have a headache.  You have neck pain or a stiff neck.  You have a fever. This information is not intended to replace advice given to you by your health care provider. Make sure you discuss any questions you have with your health care provider. Document Released: 05/27/2001 Document Revised: 05/08/2016 Document Reviewed: 11/27/2014 Elsevier Interactive Patient Education  2017 [employer].    IF you received an x-ray today, you will receive an invoice from [facility]. Please contact [facility] at [phone] with questions or concerns regarding your invoice.   IF you received labwork today, you will receive an invoice from [facility]. Please contact [facility] at [phone] with questions or concerns regarding your invoice.   Our billing staff will not be able to assist you with questions regarding bills from these companies.  You will be contacted with the lab results as soon as they are available. The fastest way to get your results is to activate your My Chart account. Instructions are located on the last page of this paperwork. If you have not heard from [place] regarding the results in 2 weeks, please contact this office.      I personally performed the services described  in this documentation, which was scribed in my presence. The recorded information has been reviewed and considered for accuracy and completeness, addended by me as needed, and agree with information above.  Signed,   Merri Ray, MD [facility].  07/18/17 10:11 PM

## 2017-07-17 NOTE — Patient Instructions (Addendum)
If frequent bruising is noted, please return to discuss further. I will check your blood counts today.  I will check blood counts for lightheadedness as well, but please return if that persists.  Keep a record of your blood pressures outside of the office and bring them to the next office visit.  If running low, then will need to change meds. Follow up to discuss further if that occurs. Make sure you drink plenty of fluids, especially when exercising.   I will check thyroid levels today to see if dose changes needed.   I will schedule an ultrasound to evaluate the heart murmur further. Please follow-up within the next 3-4 weeks to discuss the results of these tests and your dizziness symptoms further. Follow-up sooner if worse.  Return to the clinic or go to the nearest emergency room if any of your symptoms worsen or new symptoms occur.   Heart Murmur A heart murmur is an extra sound that is caused by chaotic blood flow. The murmur can be heard as a "hum" or "whoosh" sound when blood flows through the heart. The heart has four areas called chambers. Valves separate the upper and lower chambers from each other (tricuspid valve and mitral valve) and separate the lower chambers of the heart from pathways that lead away from the heart (aortic valve and pulmonary valve). Normally, the valves open to let blood flow through or out of your heart, and then they shut to keep the blood from flowing backward. There are two types of heart murmurs:  Innocent murmurs. Most people with this type of heart murmur do not have a heart problem. Many children have innocent heart murmurs. Your health care provider may suggest some basic testing to find out whether your murmur is an innocent murmur. If an innocent heart murmur is found, there is no need for further tests or treatment and no need to restrict activities or stop playing sports.  Abnormal murmurs. These types of murmurs can occur in children and adults.  Abnormal murmurs may be a sign of a more serious heart condition, such as a heart defect present at birth (congenital defect) or heart valve disease.  What are the causes? This condition is caused by heart valves that are not working properly. In children, abnormal heart murmurs are typically caused by congenital defects. In adults, abnormal murmurs are usually from heart valve problems caused by disease, infection, or aging. Three types of heart valve defects can cause a murmur:  Regurgitation. This is when blood leaks back through the valve in the wrong direction.  Mitral valve prolapse. This is when the mitral valve of the heart has a loose flap and does not close tightly.  Stenosis. This is when a valve does not open enough and blocks blood flow.  This condition may also be caused by:  Pregnancy.  Fever.  Overactive thyroid gland.  Anemia.  Exercise.  Rapid growth spurts (in children).  What are the signs or symptoms? Innocent murmurs do not cause symptoms, and many people with abnormal murmurs may or may not have symptoms. If symptoms do develop, they may include:  Shortness of breath.  Blue coloring of the skin, especially on the fingertips.  Chest pain.  Palpitations, or feeling a fluttering or skipped heartbeat.  Fainting.  Persistent cough.  Getting tired much faster than expected.  Swelling in the abdomen, feet, or ankles.  How is this diagnosed? This condition may be diagnosed during a routine physical or other exam. If your  health care provider hears a murmur with a stethoscope, he or she will listen for:  Where the murmur is located in your heart.  How long the murmur lasts (duration).  When the murmur is heard during the heartbeat.  How loud the murmur is. This may help the health care provider figure out what is causing the murmur.  You may be referred to a heart specialist (cardiologist). You may also have other tests,  including:  Electrocardiogram (ECG or EKG). This test measures the electrical activity of your heart.  Echocardiogram. This test uses high frequency sound waves to make pictures of your heart.  MRI or chest X-ray.  Cardiac catheterization. This test looks at blood flow through the heart.  For children and adults who have an abnormal heart murmur and want to stay active, it is important to complete testing, review test results, and receive recommendations from your health care provider. If heart disease is present, it may not be safe to play or be active. How is this treated? Heart murmurs themselves do not need treatment. In some cases, a heart murmur may go away on its own. If an underlying problem or disease is causing the murmur, you may need treatment. If treatment is needed, it will depend on the type and severity of the disease or heart problem causing the murmur. Treatment may include:  Medicine.  Surgery.  Dietary and lifestyle changes.  Follow these instructions at home:  Talk with your health care provider before participating in sports or other activities that require a lot of effort and energy (are strenuous).  Learn as much as possible about your condition and any related diseases. Ask your health care provider if you may at risk for any medical emergencies.  Talk with your health care provider about what symptoms you should look out for.  It is up to you to get your test results. Ask your health care provider, or the department that is doing the test, when your results will be ready.  Keep all follow-up visits as told by your health care provider. This is important. Contact a health care provider if:  You feel light-headed.  You are frequently short of breath.  You feel more tired than usual.  You are having a hard time keeping up with normal activities or fitness routines.  You have swelling in your ankles or feet.  You have chest pain.  You notice that  your heart often beats irregularly.  You develop any new symptoms. Get help right away if:  You develop severe chest pain.  You are having trouble breathing.  You have fainting spells.  Your symptoms suddenly get worse. These symptoms may represent a serious problem that is an emergency. Do not wait to see if the symptoms will go away. Get medical help right away. Call your local emergency services (911 in the [place] Do not drive yourself to the hospital. Summary  Normally, the heart valves open to let blood flow through or out of your heart, and then they shut to keep the blood from flowing backward.  Heart murmur is caused by heart valves that are not working properly.  You may need treatment if an underlying problem or disease is causing the heart murmur. Treatment may include medicine, surgery, or dietary and lifestyle changes.  Talk with your health care provider before participating in sports or other activities that require a lot of effort and energy (are strenuous).  Talk with your health care provider about what symptoms  you should watch out for. This information is not intended to replace advice given to you by your health care provider. Make sure you discuss any questions you have with your health care provider. Document Released: 01/08/2005 Document Revised: 11/19/2016 Document Reviewed: 11/19/2016 Elsevier Interactive Patient Education  2017 [employer]    Dizziness Dizziness is a common problem. It is a feeling of unsteadiness or light-headedness. You may feel like you are about to faint. Dizziness can lead to injury if you stumble or fall. Anyone can become dizzy, but dizziness is more common in older adults. This condition can be caused by a number of things, including medicines, dehydration, or illness. Follow these instructions at home: Taking these steps may help with your condition: Eating and drinking  Drink enough fluid to keep your urine clear or pale  yellow. This helps to keep you from becoming dehydrated. Try to drink more clear fluids, such as water.  Do not drink alcohol.  Limit your caffeine intake if directed by your health care provider.  Limit your salt intake if directed by your health care provider. Activity  Avoid making quick movements. ? Rise slowly from chairs and steady yourself until you feel okay. ? In the morning, first sit up on the side of the bed. When you feel okay, stand slowly while you hold onto something until you know that your balance is fine.  Move your legs often if you need to stand in one place for a long time. Tighten and relax your muscles in your legs while you are standing.  Do not drive or operate heavy machinery if you feel dizzy.  Avoid bending down if you feel dizzy. Place items in your home so that they are easy for you to reach without leaning over. Lifestyle  Do not use any tobacco products, including cigarettes, chewing tobacco, or electronic cigarettes. If you need help quitting, ask your health care provider.  Try to reduce your stress level, such as with yoga or meditation. Talk with your health care provider if you need help. General instructions  Watch your dizziness for any changes.  Take medicines only as directed by your health care provider. Talk with your health care provider if you think that your dizziness is caused by a medicine that you are taking.  Tell a friend or a family member that you are feeling dizzy. If he or she notices any changes in your behavior, have this person call your health care provider.  Keep all follow-up visits as directed by your health care provider. This is important. Contact a health care provider if:  Your dizziness does not go away.  Your dizziness or light-headedness gets worse.  You feel nauseous.  You have reduced hearing.  You have new symptoms.  You are unsteady on your feet or you feel like the room is spinning. Get help right  away if:  You vomit or have diarrhea and are unable to eat or drink anything.  You have problems talking, walking, swallowing, or using your arms, hands, or legs.  You feel generally weak.  You are not thinking clearly or you have trouble forming sentences. It may take a friend or family member to notice this.  You have chest pain, abdominal pain, shortness of breath, or sweating.  Your vision changes.  You notice any bleeding.  You have a headache.  You have neck pain or a stiff neck.  You have a fever. This information is not intended to replace advice given  to you by your health care provider. Make sure you discuss any questions you have with your health care provider. Document Released: 05/27/2001 Document Revised: 05/08/2016 Document Reviewed: 11/27/2014 Elsevier Interactive Patient Education  2017 [employer].    IF you received an x-ray today, you will receive an invoice from [facility]. Please contact [facility] at [phone] with questions or concerns regarding your invoice.   IF you received labwork today, you will receive an invoice from [facility]. Please contact [facility] at [phone] with questions or concerns regarding your invoice.   Our billing staff will not be able to assist you with questions regarding bills from these companies.  You will be contacted with the lab results as soon as they are available. The fastest way to get your results is to activate your My Chart account. Instructions are located on the last page of this paperwork. If you have not heard from [place] regarding the results in 2 weeks, please contact this office.

## 2017-07-18 LAB — COMPREHENSIVE METABOLIC PANEL
A/G RATIO: 1.9 (ref 1.2–2.2)
ALBUMIN: 4.2 g/dL (ref 3.5–5.5)
ALK PHOS: 68 IU/L (ref 39–117)
ALT: 18 IU/L (ref 0–32)
AST: 24 IU/L (ref 0–40)
BILIRUBIN TOTAL: 0.7 mg/dL (ref 0.0–1.2)
BUN/Creatinine Ratio: 13 (ref 9–23)
BUN: 11 mg/dL (ref 6–24)
CO2: 21 mmol/L (ref 20–29)
Calcium: 9 mg/dL (ref 8.7–10.2)
Chloride: 102 mmol/L (ref 96–106)
Creatinine, Ser: 0.86 mg/dL (ref 0.57–1.00)
GFR calc Af Amer: 95 mL/min/{1.73_m2} (ref >59)
GFR calc non Af Amer: 82 mL/min/{1.73_m2} (ref >59)
Globulin, Total: 2.2 g/dL (ref 1.5–4.5)
Glucose: 88 mg/dL (ref 65–99)
POTASSIUM: 3.9 mmol/L (ref 3.5–5.2)
SODIUM: 139 mmol/L (ref 134–144)
TOTAL PROTEIN: 6.4 g/dL (ref 6.0–8.5)

## 2017-07-18 LAB — CBC
HEMATOCRIT: 40.7 % (ref 34.0–46.6)
Hemoglobin: 13.3 g/dL (ref 11.1–15.9)
MCH: 28.4 pg (ref 26.6–33.0)
MCHC: 32.7 g/dL (ref 31.5–35.7)
MCV: 87 fL (ref 79–97)
PLATELETS: 208 10*3/uL (ref 150–379)
RBC: 4.68 x10E6/uL (ref 3.77–5.28)
RDW: 15.3 % (ref 12.3–15.4)
WBC: 5.7 10*3/uL (ref 3.4–10.8)

## 2017-07-18 LAB — LIPID PANEL
CHOLESTEROL TOTAL: 190 mg/dL (ref 100–199)
Chol/HDL Ratio: 3.2 ratio (ref 0.0–4.4)
HDL: 60 mg/dL (ref >39)
LDL Calculated: 115 mg/dL — ABNORMAL HIGH (ref 0–99)
Triglycerides: 76 mg/dL (ref 0–149)
VLDL CHOLESTEROL CAL: 15 mg/dL (ref 5–40)

## 2017-07-18 LAB — TSH+FREE T4
Free T4: 1.38 ng/dL (ref 0.82–1.77)
TSH: 5.24 u[IU]/mL — ABNORMAL HIGH (ref 0.450–4.500)

## 2017-07-27 ENCOUNTER — Other Ambulatory Visit: Payer: Self-pay

## 2017-07-27 ENCOUNTER — Ambulatory Visit (HOSPITAL_COMMUNITY): Payer: BC Managed Care – PPO | Attending: Cardiovascular Disease

## 2017-07-27 DIAGNOSIS — R011 Cardiac murmur, unspecified: Secondary | ICD-10-CM | POA: Diagnosis not present

## 2017-07-27 DIAGNOSIS — I10 Essential (primary) hypertension: Secondary | ICD-10-CM | POA: Diagnosis not present

## 2017-07-27 DIAGNOSIS — I071 Rheumatic tricuspid insufficiency: Secondary | ICD-10-CM | POA: Diagnosis not present

## 2017-07-27 DIAGNOSIS — R42 Dizziness and giddiness: Secondary | ICD-10-CM

## 2017-07-27 DIAGNOSIS — E039 Hypothyroidism, unspecified: Secondary | ICD-10-CM | POA: Diagnosis not present

## 2017-08-14 ENCOUNTER — Ambulatory Visit
Admission: RE | Admit: 2017-08-14 | Discharge: 2017-08-14 | Disposition: A | Discharge status: Home / Self Care | Payer: BC Managed Care – PPO | Source: Ambulatory Visit | Attending: Family Medicine | Admitting: Family Medicine

## 2017-08-14 DIAGNOSIS — Z1231 Encounter for screening mammogram for malignant neoplasm of breast: Secondary | ICD-10-CM

## 2017-08-17 ENCOUNTER — Encounter: Payer: Self-pay | Admitting: Family Medicine

## 2017-08-20 ENCOUNTER — Other Ambulatory Visit: Payer: Self-pay | Admitting: Family Medicine

## 2017-08-20 DIAGNOSIS — R001 Bradycardia, unspecified: Secondary | ICD-10-CM

## 2017-09-10 ENCOUNTER — Encounter: Payer: Self-pay | Admitting: Family Medicine

## 2017-09-10 ENCOUNTER — Ambulatory Visit (INDEPENDENT_AMBULATORY_CARE_PROVIDER_SITE_OTHER): Payer: BC Managed Care – PPO

## 2017-09-10 ENCOUNTER — Ambulatory Visit (INDEPENDENT_AMBULATORY_CARE_PROVIDER_SITE_OTHER): Payer: BC Managed Care – PPO | Admitting: Family Medicine

## 2017-09-10 VITALS — BP 128/80 | HR 66 | Temp 98.6°F | Resp 16 | Ht 64.0 in | Wt 179.4 lb

## 2017-09-10 DIAGNOSIS — M25462 Effusion, left knee: Secondary | ICD-10-CM

## 2017-09-10 DIAGNOSIS — M25562 Pain in left knee: Secondary | ICD-10-CM | POA: Diagnosis not present

## 2017-09-10 DIAGNOSIS — M7989 Other specified soft tissue disorders: Secondary | ICD-10-CM | POA: Diagnosis not present

## 2017-09-10 MED ORDER — MELOXICAM 7.5 MG PO TABS: 7.5000 mg | ORAL_TABLET | Freq: Every day | ORAL | 0 refills | Status: DC

## 2017-09-10 NOTE — Progress Notes (Signed)
Subjective:  This chart was scribed for Wendie Agreste, MD by Tamsen Roers, at [place] [facility].  This patient was seen in room 12 and the patient's care was started at 11:48 AM.   Chief Complaint  Patient presents with  . Knee Pain    left ankle pain, both started  swelling started Friday      Patient ID: Heidi Soto, female    DOB: 1971-03-21, 46 y.o.   MRN: [id number]  HPI HPI Comments: Heidi Soto is a 46 y.o. female who presents to Primary Care at [facility] complaining of left knee pain/ swelling onset about 6 days ago.  Patient ran 6 miles about one month ago (ran an extra mile- usually does 5) and had some calf and hamstring tightness after the run.  She did not stretch before running at that time.  Due to the discomfort,  she decided not to run on tuesdays and thursdays (which she usually does every week) and instead focused on doing low impact exercises.  About a week ago, she noticed her leg felt very tight so she stretched and continued her workouts (lunges/ low impact) at the gym.  When she woke up the next day , she noticed increased swelling and pain.  She did part take in 5 hour step aerobics and led other exercise classes afterwards.  During these workouts, her knee didn't give out but she did still have some tightness/pain. She has been taking ibuprofen (2 pills, once per day) and states that is only mildly alleviates the pain/swelling.   She has a history of hypertension, hypothyroidism, and Raynauds syndrome.   Patient Active Problem List   Diagnosis Date Noted  . Obesity, unspecified 03/13/2014  . S/P abdominal hysterectomy 06/30/2013  . H/O Graves' disease 12/30/2012  . Fibroids 12/30/2012   Past Medical History:  Diagnosis Date  . Graves disease   . Headache(784.0)   . Hypertension   . Thyroid disease    Past Surgical History:  Procedure Laterality Date  . ABDOMINAL HYSTERECTOMY N/A 06/29/2013   Procedure: HYSTERECTOMY ABDOMINAL;   Surgeon: Elveria Royals, MD;  Location: [facility];  Service: Gynecology;  Laterality: N/A;  . BILATERAL SALPINGECTOMY Bilateral 06/29/2013   Procedure: BILATERAL SALPINGECTOMY;  Surgeon: Elveria Royals, MD;  Location: [facility];  Service: Gynecology;  Laterality: Bilateral;  . NO PAST SURGERIES     Allergies  Allergen Reactions  . Bactroban [Mupirocin Calcium] Rash   Prior to Admission medications   Medication Sig Start Date End Date Taking? Authorizing Provider  hydrochlorothiazide (MICROZIDE) 12.5 MG capsule Take 1 capsule (12.5 mg total) by mouth daily. 07/17/17   Wendie Agreste, MD  levothyroxine (SYNTHROID, LEVOTHROID) 88 MCG tablet Take 1 tablet (88 mcg total) by mouth daily before breakfast. 07/17/17   Wendie Agreste, MD  Multiple Vitamin (MULTIVITAMIN WITH MINERALS) TABS Take 1 tablet by mouth daily. Reported on 05/19/2016    [provider]   Social History   Social History  . Marital status: Single    Spouse name: N/A  . Number of children: N/A  . Years of education: N/A   Occupational History  . Not on file.   Social History Main Topics  . Smoking status: Never Smoker  . Smokeless tobacco: Never Used  . Alcohol use No     Comment: "not often"  . Drug use: No  . Sexual activity: Yes    Partners: Male   Other Topics Concern  .  Not on file   Social History Narrative  . No narrative on file      Review of Systems  Constitutional: Negative for chills and fever.  Eyes: Negative for pain and redness.  Respiratory: Negative for cough, choking and shortness of breath.   Gastrointestinal: Negative for nausea and vomiting.  Musculoskeletal: Negative for neck pain and neck stiffness.       Knee pain/swelling  Neurological: Negative for syncope and speech difficulty.       Objective:   Physical Exam  Constitutional: She appears well-developed and well-nourished. No distress.  HENT:  Head: Normocephalic and atraumatic.  Pulmonary/Chest: Effort normal. No  respiratory distress.  Musculoskeletal: Normal range of motion.  Right knee: full ROM, pain free, no apparent effusion.  Right calf is non tender, no swelling.  1+ effusion on left knee. Slight discomfort with flexion, but full flexion, some crepitance with motion. Tender medial and lateral joint lines anteriorly,  popliteal  fossa is non tender. Calf is non tender, left ankle pain free range of motion, no bony tenderness. Appears to be possible slight swelling around the left ankle, skin is intact, no erythema. Negative drawer and negative talar tilt. Slight discomfort with flexion and internal rotation on mc murray, negative varus/valgus.   calf circumference which was measured 15 cm below patella- 38 on the right, 38.5 on the left.  Skin: Skin is warm and dry.  Psychiatric: She has a normal mood and affect.    Vitals:   09/10/17 1134  BP: 128/80  Pulse: 66  Resp: 16  Temp: 98.6 F (37 C)  SpO2: 100%  Weight: 179 lb 6.4 oz (81.4 kg)  Height: 5\' 4"  (1.626 m)   Dg Knee Complete 4 Views Left  Result Date: 09/10/2017 CLINICAL DATA:  Left knee pain.  No known injury. EXAM: LEFT KNEE - COMPLETE 4+ VIEW COMPARISON:  None. FINDINGS: No fracture or dislocation. No joint effusion. Mild degenerative changes in the medial compartment. Possible minimal chondrocalcinosis laterally. IMPRESSION: 1. Possible minimal chondrocalcinosis laterally raising the question of CPPD. 2. Mild degenerative changes, particularly in the medial compartment. Electronically Signed   By: Dorise Bullion III M.D   On: 09/10/2017 12:38      Assessment & Plan:    Heidi Soto is a 46 y.o. female Acute pain of left knee - Plan: DG Knee Complete 4 Views Left, meloxicam (MOBIC) 7.5 MG tablet  Swelling of joint of left knee - Plan: DG Knee Complete 4 Views Left, meloxicam (MOBIC) 7.5 MG tablet  Left leg swelling  Possible CPPD by XR.  Episodic mechanical sx's but mild, and some degenerative changes. Degenerative  meniscal tear possible.   - trial of Mobic 7.5mg  qd, web reaction knee brace, then recheck in next 2 weeks.   - min lower leg swelling - may be due to effusion in knee. Advised if any increased calf swelling or localized calf pain, could order ultrasound, but unlikely DVT.   - rtc precautions if worsening.   Meds ordered this encounter  Medications  . meloxicam (MOBIC) 7.5 MG tablet    Sig: Take 1 tablet (7.5 mg total) by mouth daily.    Dispense:  30 tablet    Refill:  0   Patient Instructions    Knee swelling and pain may be due to some wear and tear or arthritis within the knee. I will check an x-ray to look at that further. I suspect some of the swelling in the leg and ankle  may be due to swelling from your knee. At this point does not appear to be blood clot, but if you have any increasing pain or swelling of the calf, let me know and I will order an ultrasound. Otherwise try the knee brace, range of motion and exercise as tolerated, meloxicam once per day as needed, do not combine with ibuprofen or Aleve or other anti-inflammatories. Tylenol over-the-counter is okay if needed.   Recheck in 2 weeks, sooner if worse.    Knee Pain, Adult Knee pain in adults is common. It can be caused by many things, including:  Arthritis.  A fluid-filled sac (cyst) or growth in your knee.  An infection in your knee.  An injury that will not heal.  Damage, swelling, or irritation of the tissues that support your knee.  Knee pain is usually not a sign of a serious problem. The pain may go away on its own with time and rest. If it does not, a health care provider may order tests to find the cause of the pain. These may include:  Imaging tests, such as an X-ray, MRI, or ultrasound.  Joint aspiration. In this test, fluid is removed from the knee.  Arthroscopy. In this test, a lighted tube is inserted into knee and an image is projected onto a TV screen.  A biopsy. In this test, a sample of  tissue is removed from the body and studied under a microscope.  Follow these instructions at home: Pay attention to any changes in your symptoms. Take these actions to relieve your pain. Activity  Rest your knee.  Do not do things that cause pain or make pain worse.  Avoid high-impact activities or exercises, such as running, jumping rope, or doing jumping jacks. General instructions  Take over-the-counter and prescription medicines only as told by your health care provider.  Raise (elevate) your knee above the level of your heart when you are sitting or lying down.  Sleep with a pillow under your knee.  If directed, apply ice to the knee: ? Put ice in a plastic bag. ? Place a towel between your skin and the bag. ? Leave the ice on for 20 minutes, 2-3 times a day.  Ask your health care provider if you should wear an elastic knee support.  Lose weight if you are overweight. Extra weight can put pressure on your knee.  Do not use any products that contain nicotine or tobacco, such as cigarettes and e-cigarettes. Smoking may slow the healing of any bone and joint problems that you may have. If you need help quitting, ask your health care provider. Contact a health care provider if:  Your knee pain continues, changes, or gets worse.  You have a fever along with knee pain.  Your knee buckles or locks up.  Your knee swells, and the swelling becomes worse. Get help right away if:  Your knee feels warm to the touch.  You cannot move your knee.  You have severe pain in your knee.  You have chest pain.  You have trouble breathing. Summary  Knee pain in adults is common. It can be caused by many things, including, arthritis, infection, cysts, or injury.  Knee pain is usually not a sign of a serious problem, but if it does not go away, a health care provider may perform tests to know the cause of the pain.  Pay attention to any changes in your symptoms. Relieve your pain  with rest, medicines, light activity, and  use of ice.  Get help if your pain continues or becomes very severe, or if your knee buckles or locks up, or if you have chest pain or trouble breathing. This information is not intended to replace advice given to you by your health care provider. Make sure you discuss any questions you have with your health care provider. Document Released: 09/28/2007 Document Revised: 11/21/2016 Document Reviewed: 11/21/2016 Elsevier Interactive Patient Education  2018 [employer].    IF you received an x-ray today, you will receive an invoice from [facility]. Please contact [facility] at [phone] with questions or concerns regarding your invoice.   IF you received labwork today, you will receive an invoice from [place]. Please contact [facility] at [phone] with questions or concerns regarding your invoice.   Our billing staff will not be able to assist you with questions regarding bills from these companies.  You will be contacted with the lab results as soon as they are available. The fastest way to get your results is to activate your My Chart account. Instructions are located on the last page of this paperwork. If you have not heard from [place] regarding the results in 2 weeks, please contact this office.       I personally performed the services described in this documentation, which was scribed in my presence. The recorded information has been reviewed and considered for accuracy and completeness, addended by me as needed, and agree with information above.  Signed,   Merri Ray, MD [facility] at [place].  09/10/17 9:31 PM

## 2017-09-10 NOTE — Patient Instructions (Addendum)
Knee swelling and pain may be due to some wear and tear or arthritis within the knee. I will check an x-ray to look at that further. I suspect some of the swelling in the leg and ankle may be due to swelling from your knee. At this point does not appear to be blood clot, but if you have any increasing pain or swelling of the calf, let me know and I will order an ultrasound. Otherwise try the knee brace, range of motion and exercise as tolerated, meloxicam once per day as needed, do not combine with ibuprofen or Aleve or other anti-inflammatories. Tylenol over-the-counter is okay if needed.   Recheck in 2 weeks, sooner if worse.    Knee Pain, Adult Knee pain in adults is common. It can be caused by many things, including:  Arthritis.  A fluid-filled sac (cyst) or growth in your knee.  An infection in your knee.  An injury that will not heal.  Damage, swelling, or irritation of the tissues that support your knee.  Knee pain is usually not a sign of a serious problem. The pain may go away on its own with time and rest. If it does not, a health care provider may order tests to find the cause of the pain. These may include:  Imaging tests, such as an X-ray, MRI, or ultrasound.  Joint aspiration. In this test, fluid is removed from the knee.  Arthroscopy. In this test, a lighted tube is inserted into knee and an image is projected onto a TV screen.  A biopsy. In this test, a sample of tissue is removed from the body and studied under a microscope.  Follow these instructions at home: Pay attention to any changes in your symptoms. Take these actions to relieve your pain. Activity  Rest your knee.  Do not do things that cause pain or make pain worse.  Avoid high-impact activities or exercises, such as running, jumping rope, or doing jumping jacks. General instructions  Take over-the-counter and prescription medicines only as told by your health care provider.  Raise (elevate) your  knee above the level of your heart when you are sitting or lying down.  Sleep with a pillow under your knee.  If directed, apply ice to the knee: ? Put ice in a plastic bag. ? Place a towel between your skin and the bag. ? Leave the ice on for 20 minutes, 2-3 times a day.  Ask your health care provider if you should wear an elastic knee support.  Lose weight if you are overweight. Extra weight can put pressure on your knee.  Do not use any products that contain nicotine or tobacco, such as cigarettes and e-cigarettes. Smoking may slow the healing of any bone and joint problems that you may have. If you need help quitting, ask your health care provider. Contact a health care provider if:  Your knee pain continues, changes, or gets worse.  You have a fever along with knee pain.  Your knee buckles or locks up.  Your knee swells, and the swelling becomes worse. Get help right away if:  Your knee feels warm to the touch.  You cannot move your knee.  You have severe pain in your knee.  You have chest pain.  You have trouble breathing. Summary  Knee pain in adults is common. It can be caused by many things, including, arthritis, infection, cysts, or injury.  Knee pain is usually not a sign of a serious problem, but if  it does not go away, a health care provider may perform tests to know the cause of the pain.  Pay attention to any changes in your symptoms. Relieve your pain with rest, medicines, light activity, and use of ice.  Get help if your pain continues or becomes very severe, or if your knee buckles or locks up, or if you have chest pain or trouble breathing. This information is not intended to replace advice given to you by your health care provider. Make sure you discuss any questions you have with your health care provider. Document Released: 09/28/2007 Document Revised: 11/21/2016 Document Reviewed: 11/21/2016 Elsevier Interactive Patient Education  2018 [employer].    IF you received an x-ray today, you will receive an invoice from [facility]. Please contact [facility] at [phone] with questions or concerns regarding your invoice.   IF you received labwork today, you will receive an invoice from [place]. Please contact [facility] at [phone] with questions or concerns regarding your invoice.   Our billing staff will not be able to assist you with questions regarding bills from these companies.  You will be contacted with the lab results as soon as they are available. The fastest way to get your results is to activate your My Chart account. Instructions are located on the last page of this paperwork. If you have not heard from [place] regarding the results in 2 weeks, please contact this office.

## 2017-09-12 ENCOUNTER — Encounter: Payer: Self-pay | Admitting: Family Medicine

## 2017-09-12 DIAGNOSIS — M7989 Other specified soft tissue disorders: Secondary | ICD-10-CM

## 2017-09-16 NOTE — Telephone Encounter (Signed)
See my chart email. .Calf swelling/pain, prior travel. Suspected swelling from knee, but now with new knot. [place] ordered for DVT rule out. Please obtain in next 2 days.

## 2017-09-29 ENCOUNTER — Ambulatory Visit (INDEPENDENT_AMBULATORY_CARE_PROVIDER_SITE_OTHER): Payer: BC Managed Care – PPO | Admitting: Family Medicine

## 2017-09-29 ENCOUNTER — Encounter: Payer: Self-pay | Admitting: Family Medicine

## 2017-09-29 VITALS — BP 130/76 | HR 54 | Temp 98.3°F | Resp 16 | Ht 64.0 in | Wt 174.2 lb

## 2017-09-29 DIAGNOSIS — M25562 Pain in left knee: Secondary | ICD-10-CM

## 2017-09-29 NOTE — Patient Instructions (Addendum)
Still with some swelling and symptoms would recommend further evaluation with orthopedics. I will refer you to [facility]. Okay to use brace as needed, meloxicam as needed. Let me know if any worsening symptoms.  I do not appreciate any calf swelling today, or signs of blood clot. If you do have any new calf pain or swelling, we can look at ordering an ultrasound at that time.  Return to the clinic or go to the nearest emergency room if any of your symptoms worsen or new symptoms occur.   IF you received an x-ray today, you will receive an invoice from [facility]. Please contact [facility] at [phone] with questions or concerns regarding your invoice.   IF you received labwork today, you will receive an invoice from [place]. Please contact [facility] at [phone] with questions or concerns regarding your invoice.   Our billing staff will not be able to assist you with questions regarding bills from these companies.  You will be contacted with the lab results as soon as they are available. The fastest way to get your results is to activate your My Chart account. Instructions are located on the last page of this paperwork. If you have not heard from [place] regarding the results in 2 weeks, please contact this office.

## 2017-09-29 NOTE — Progress Notes (Signed)
Subjective:  By signing my name below, I, Essence Howell, attest that this documentation has been prepared under the direction and in the presence of Wendie Agreste, MD Electronically Signed: Ladene Artist, ED Scribe 09/29/2017 at 5:16 PM.   Patient ID: Heidi Soto, female    DOB: 11/06/1971, 46 y.o.   MRN: [id number]  Chief Complaint  Patient presents with  . knee issue    left knee; states it is getting better  . Follow-up   HPI Heidi Soto is a 46 y.o. female who presents to Primary Care at [facility] for a follow-up. Seen 9/27 for L knee pain. Treated with Meloxicam and web reaction knee brace. Possible CPPD on XR. Minimal lower leg swelling thought to be due to knee effusion.   Pt states that L knee pain and swelling has improved. She only took Meloxicam for ~1 week and stopped medication once swelling improved. 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 Still reports some tightness behind the calf and some popping in the L knee. Denies cp, sob, knee locking or giving away. No h/o DVT.  Patient Active Problem List   Diagnosis Date Noted  . Obesity, unspecified 03/13/2014  . S/P abdominal hysterectomy 06/30/2013  . H/O Graves' disease 12/30/2012  . Fibroids 12/30/2012   Past Medical History:  Diagnosis Date  . Graves disease   . Headache(784.0)   . Hypertension   . Thyroid disease    Past Surgical History:  Procedure Laterality Date  . ABDOMINAL HYSTERECTOMY N/A 06/29/2013   Procedure: HYSTERECTOMY ABDOMINAL;  Surgeon: Elveria Royals, MD;  Location: [facility];  Service: Gynecology;  Laterality: N/A;  . BILATERAL SALPINGECTOMY Bilateral 06/29/2013   Procedure: BILATERAL SALPINGECTOMY;  Surgeon: Elveria Royals, MD;  Location: [facility];  Service: Gynecology;  Laterality: Bilateral;  . NO PAST SURGERIES     Allergies  Allergen Reactions  . Bactroban [Mupirocin Calcium] Rash   Prior to Admission medications     Medication Sig Start Date End Date Taking? Authorizing Provider  hydrochlorothiazide (MICROZIDE) 12.5 MG capsule Take 1 capsule (12.5 mg total) by mouth daily. 07/17/17   Wendie Agreste, MD  levothyroxine (SYNTHROID, LEVOTHROID) 88 MCG tablet Take 1 tablet (88 mcg total) by mouth daily before breakfast. 07/17/17   Wendie Agreste, MD  meloxicam (MOBIC) 7.5 MG tablet Take 1 tablet (7.5 mg total) by mouth daily. 09/10/17   Wendie Agreste, MD  Multiple Vitamin (MULTIVITAMIN WITH MINERALS) TABS Take 1 tablet by mouth daily. Reported on 05/19/2016    [provider]   Social History   Social History  . Marital status: Single    Spouse name: N/A  . Number of children: N/A  . Years of education: N/A   Occupational History  . Not on file.   Social History Main Topics  . Smoking status: Never Smoker  . Smokeless tobacco: Never Used  . Alcohol use No     Comment: "not often"  . Drug use: No  . Sexual activity: Yes    Partners: Male   Other Topics Concern  . Not on file   Social History Narrative  . No narrative on file   Review of Systems  Respiratory: Negative for shortness of breath.   Cardiovascular: Negative for chest pain.  Musculoskeletal: Positive for arthralgias. Negative for joint swelling.      Objective:   Physical Exam  Constitutional: She is oriented to person, place, and  time. She appears well-developed and well-nourished. No distress.  HENT:  Head: Normocephalic and atraumatic.  Eyes: Conjunctivae and EOM are normal.  Neck: Neck supple. No tracheal deviation present.  Cardiovascular: Normal rate.   Pulmonary/Chest: Effort normal. No respiratory distress.  Musculoskeletal: Normal range of motion.  L knee: trace to 1+ effusion. Full flexion and extension. Some tenderness along the medial joint line. Slight crepitus with McMurray but no specific pain. Appears to have overall normal patellar tracking. Negative varus and valgus. Negative drawer. Calf is  nontender. Negative Homan's. Calf circumference equal 37 cm bilaterally measure ~15 cm below patellar.   Neurological: She is alert and oriented to person, place, and time.  Skin: Skin is warm and dry.  Psychiatric: She has a normal mood and affect. Her behavior is normal.  Nursing note and vitals reviewed.  Vitals:   09/29/17 1634  BP: 130/76  Pulse: (!) 54  Resp: 16  Temp: 98.3 F (36.8 C)  TempSrc: Oral  SpO2: 99%  Weight: 174 lb 3.2 oz (79 kg)  Height: 5\' 4"  (1.626 m)      Assessment & Plan:  Heidi Soto is a 46 y.o. 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  -Continue bracing as needed, meloxicam as needed, referral to orthopedics to decide if physical therapy versus injection versus advanced imaging needed as next step. Activity modification discussed as needed, but activity as tolerated,   No orders of the defined types were placed in this encounter.  Patient Instructions   Still with some swelling and symptoms would recommend further evaluation with orthopedics. I will refer you to [facility]. Okay to use brace as needed, meloxicam as needed. Let me know if any worsening symptoms.  I do not appreciate any calf swelling today, or signs of blood clot. If you do have any new calf pain or swelling, we can look at ordering an ultrasound at that time.  Return to the clinic or go to the nearest emergency room if any of your symptoms worsen or new symptoms occur.   IF you received an x-ray today, you will receive an invoice from [facility]. Please contact [facility] at [phone] with questions or concerns regarding your invoice.   IF you received labwork today, you will receive an invoice from [place]. Please contact [facility] at [phone] with questions or concerns regarding your invoice.    Our billing staff will not be able to assist you with questions regarding bills from these companies.  You will be contacted with the lab results as soon as they are available. The fastest way to get your results is to activate your My Chart account. Instructions are located on the last page of this paperwork. If you have not heard from [place] regarding the results in 2 weeks, please contact this office.       I personally performed the services described in this documentation, which was scribed in my presence. The recorded information has been reviewed and considered for accuracy and completeness, addended by me as needed, and agree with information above.  Signed,   Merri Ray, MD Primary Care at [facility].  10/02/17 5:51 PM

## 2017-10-26 ENCOUNTER — Ambulatory Visit (HOSPITAL_COMMUNITY)
Admission: RE | Admit: 2017-10-26 | Discharge: 2017-10-26 | Disposition: A | Discharge status: Home / Self Care | Payer: BC Managed Care – PPO | Source: Ambulatory Visit | Attending: Orthopedic Surgery | Admitting: Orthopedic Surgery

## 2017-10-26 ENCOUNTER — Other Ambulatory Visit (HOSPITAL_COMMUNITY): Payer: Self-pay | Admitting: Orthopedic Surgery

## 2017-10-26 DIAGNOSIS — M7989 Other specified soft tissue disorders: Principal | ICD-10-CM

## 2017-10-26 DIAGNOSIS — M79605 Pain in left leg: Secondary | ICD-10-CM

## 2017-10-26 NOTE — Progress Notes (Signed)
Left lower extremity venous duplex completed. No evidence of a DVT, superficial thrombosis, or Baker's cyst.  Toma Copier, RVS 10/26/2017 1:42 PM

## 2018-01-05 ENCOUNTER — Other Ambulatory Visit: Payer: Self-pay | Admitting: Family Medicine

## 2018-01-05 DIAGNOSIS — E039 Hypothyroidism, unspecified: Secondary | ICD-10-CM

## 2018-01-11 ENCOUNTER — Other Ambulatory Visit: Payer: Self-pay | Admitting: Family Medicine

## 2018-01-11 DIAGNOSIS — I10 Essential (primary) hypertension: Secondary | ICD-10-CM

## 2018-01-26 ENCOUNTER — Ambulatory Visit: Payer: BC Managed Care – PPO | Admitting: Podiatry

## 2018-01-27 ENCOUNTER — Ambulatory Visit: Payer: BC Managed Care – PPO | Admitting: Podiatry

## 2018-03-09 ENCOUNTER — Ambulatory Visit: Payer: BC Managed Care – PPO | Admitting: Podiatry

## 2018-03-12 ENCOUNTER — Ambulatory Visit: Payer: BC Managed Care – PPO | Admitting: Podiatry

## 2018-03-12 ENCOUNTER — Encounter: Payer: Self-pay | Admitting: Podiatry

## 2018-03-12 DIAGNOSIS — L608 Other nail disorders: Secondary | ICD-10-CM | POA: Diagnosis not present

## 2018-03-12 DIAGNOSIS — B351 Tinea unguium: Secondary | ICD-10-CM | POA: Diagnosis not present

## 2018-03-12 NOTE — Progress Notes (Signed)
Subjective:    Patient ID: Heidi Soto, female    DOB: 07/21/1971, 47 y.o.   MRN: [id number]  HPI 47 year old female presents the office today for concerns of her right big toenail becoming thicker and discolored and she has noticed some black discharge in the nail.  She is tried over-the-counter Fungi-Nail for short amount of time but she did not feel this was helping so she stopped using it.  She denies any pain to the nail she denies any redness or drainage or any swelling or any purulence.  No recent injury to the nail.  She has no other concerns today.   Review of Systems  All other systems reviewed and are negative.  Past Medical History:  Diagnosis Date  . Graves disease   . Headache(784.0)   . Hypertension   . Thyroid disease     Past Surgical History:  Procedure Laterality Date  . ABDOMINAL HYSTERECTOMY N/A 06/29/2013   Procedure: HYSTERECTOMY ABDOMINAL;  Surgeon: Elveria Royals, MD;  Location: [facility];  Service: Gynecology;  Laterality: N/A;  . BILATERAL SALPINGECTOMY Bilateral 06/29/2013   Procedure: BILATERAL SALPINGECTOMY;  Surgeon: Elveria Royals, MD;  Location: [facility];  Service: Gynecology;  Laterality: Bilateral;  . NO PAST SURGERIES       Current Outpatient Medications:  .  hydrochlorothiazide (MICROZIDE) 12.5 MG capsule, TAKE 1 CAPSULE DAILY, Disp: 90 capsule, Rfl: 1 .  levothyroxine (SYNTHROID, LEVOTHROID) 88 MCG tablet, Take 1 tablet (88 mcg total) by mouth daily before breakfast., Disp: 90 tablet, Rfl: 1 .  levothyroxine (SYNTHROID, LEVOTHROID) 88 MCG tablet, TAKE 1 TABLET DAILY        BEFORE BREAKFAST, Disp: 90 tablet, Rfl: 1 .  meloxicam (MOBIC) 7.5 MG tablet, Take 1 tablet (7.5 mg total) by mouth daily., Disp: 30 tablet, Rfl: 0 .  Multiple Vitamin (MULTIVITAMIN WITH MINERALS) TABS, Take 1 tablet by mouth daily. Reported on 05/19/2016, Disp: , Rfl:   Allergies  Allergen Reactions  . Bactroban [Mupirocin Calcium] Rash    Social History    Socioeconomic History  . Marital status: Single    Spouse name: Not on file  . Number of children: Not on file  . Years of education: Not on file  . Highest education level: Not on file  Occupational History  . Not on file  Social Needs  . Financial resource strain: Not on file  . Food insecurity:    Worry: Not on file    Inability: Not on file  . Transportation needs:    Medical: Not on file    Non-medical: Not on file  Tobacco Use  . Smoking status: Never Smoker  . Smokeless tobacco: Never Used  Substance and Sexual Activity  . Alcohol use: No    Comment: "not often"  . Drug use: No  . Sexual activity: Yes    Partners: Male  Lifestyle  . Physical activity:    Days per week: Not on file    Minutes per session: Not on file  . Stress: Not on file  Relationships  . Social connections:    Talks on phone: Not on file    Gets together: Not on file    Attends religious service: Not on file    Active member of club or organization: Not on file    Attends meetings of clubs or organizations: Not on file    Relationship status: Not on file  . Intimate partner violence:    Fear of current or ex  partner: Not on file    Emotionally abused: Not on file    Physically abused: Not on file    Forced sexual activity: Not on file  Other Topics Concern  . Not on file  Social History Narrative  . Not on file        Objective:   Physical Exam  General: AAO x3, NAD  Dermatological: Right hallux toenail is hypertrophic, dystrophic with ill-defined discoloration and there is multiple longitudinal black discharge from the nail.  There is no extension of any hyperpigmentation into the surrounding skin and there is no swelling or redness or any drainage or purulence.  No open lesions or pre-ulcerative lesions.  Vascular: Dorsalis Pedis artery and Posterior Tibial artery pedal pulses are 2/4 bilateral with immedate capillary fill time. There is no pain with calf compression, swelling,  warmth, erythema.   Neruologic: Grossly intact via light touch bilateral.  Protective threshold with Semmes Wienstein monofilament intact to all pedal sites bilateral.   Musculoskeletal: No gross boney pedal deformities bilateral. No pain, crepitus, or limitation noted with foot and ankle range of motion bilateral. Muscular strength 5/5 in all groups tested bilateral.  Gait: Unassisted, Nonantalgic.     Assessment & Plan:  47 year old female with right hallux onychodystrophy with onychomycosis with toenail discoloration -Treatment options discussed including all alternatives, risks, and complications -Etiology of symptoms were discussed -We discussed options at this point including toenail removal as well as different treatments.  Recommend biopsy of the nail.  I did debride the toenail today without any complications of bleeding and I was able to remove a good portion of the nail. I did send this to [facility] for evaluation. Cranford Mon, CMA was given the sample.  -Follow-up after nail culture/biopsy or sooner if any issues are to arise.  I encouraged her to call any questions or concerns and she agrees with this plan.  Trula Slade DPM

## 2018-03-31 ENCOUNTER — Telehealth: Payer: Self-pay | Admitting: *Deleted

## 2018-03-31 NOTE — Telephone Encounter (Signed)
-----   Message from Trula Slade, DPM sent at 03/30/2018  6:10 PM EDT ----- Tivis Ringer- can you please let her know the the culture did not show fungus but there was micro-trauma which can cause the thickness and discoloration as well. Also it does show melanin pigment which can be normal in darker skin individuals but should watch this.   We can use a urea cream.

## 2018-03-31 NOTE — Telephone Encounter (Signed)
Left message requesting a call back for results.  

## 2018-03-31 NOTE — Telephone Encounter (Signed)
Left message informing pt Dr. Jacqualyn Posey had review her results to call for information.

## 2018-04-13 ENCOUNTER — Encounter: Payer: Self-pay | Admitting: *Deleted

## 2018-04-13 NOTE — Telephone Encounter (Signed)
Unable to leave a message the mailbox is full. Mailed note explaining Dr. Leigh Aurora review of fungal culture results.

## 2018-04-13 NOTE — Telephone Encounter (Signed)
-----   Message from Trula Slade, DPM sent at 03/30/2018  6:10 PM EDT ----- Tivis Ringer- can you please let her know the the culture did not show fungus but there was micro-trauma which can cause the thickness and discoloration as well. Also it does show melanin pigment which can be normal in darker skin individuals but should watch this.   We can use a urea cream.

## 2018-04-22 ENCOUNTER — Encounter: Payer: Self-pay | Admitting: Family Medicine

## 2018-04-22 ENCOUNTER — Other Ambulatory Visit: Payer: Self-pay | Admitting: Cardiology

## 2018-04-22 DIAGNOSIS — R079 Chest pain, unspecified: Secondary | ICD-10-CM

## 2018-04-23 NOTE — Telephone Encounter (Signed)
Sent via fax 04/23/18

## 2018-05-05 ENCOUNTER — Ambulatory Visit (HOSPITAL_COMMUNITY)
Admission: RE | Admit: 2018-05-05 | Discharge: 2018-05-05 | Disposition: A | Discharge status: Home / Self Care | Payer: BC Managed Care – PPO | Source: Ambulatory Visit | Attending: Cardiology | Admitting: Cardiology

## 2018-05-05 DIAGNOSIS — R079 Chest pain, unspecified: Secondary | ICD-10-CM | POA: Insufficient documentation

## 2018-05-05 MED ORDER — TECHNETIUM TC 99M TETROFOSMIN IV KIT
10.0000 | PACK | Freq: Once | INTRAVENOUS | Status: AC | PRN
  Administered 2018-05-05: 10 via INTRAVENOUS

## 2018-07-04 ENCOUNTER — Other Ambulatory Visit: Payer: Self-pay | Admitting: Family Medicine

## 2018-07-04 DIAGNOSIS — E039 Hypothyroidism, unspecified: Secondary | ICD-10-CM

## 2018-07-17 ENCOUNTER — Telehealth: Payer: Self-pay | Admitting: Family Medicine

## 2018-07-27 ENCOUNTER — Telehealth: Payer: Self-pay | Admitting: Family Medicine

## 2018-07-27 DIAGNOSIS — I10 Essential (primary) hypertension: Secondary | ICD-10-CM

## 2018-07-27 MED ORDER — HYDROCHLOROTHIAZIDE 12.5 MG PO CAPS: 12.5000 mg | ORAL_CAPSULE | Freq: Every day | ORAL | 0 refills | Status: DC

## 2018-07-27 NOTE — Telephone Encounter (Signed)
Copied from [place] [phone]. Topic: Quick Communication - Rx Refill/Question >> Jul 27, 2018  3:40 PM Carolyn Stare wrote: Medication hydrochlorothiazide (MICROZIDE) 12.5 MG capsule  Has the patient contacted their pharmacy yes    Preferred Pharmacy  [facility] [place]: Please be advised that RX refills may take up to 3 business days. We ask that you follow-up with your pharmacy.

## 2018-09-29 ENCOUNTER — Other Ambulatory Visit: Payer: Self-pay | Admitting: Family Medicine

## 2018-09-29 DIAGNOSIS — E039 Hypothyroidism, unspecified: Secondary | ICD-10-CM

## 2018-09-29 NOTE — Telephone Encounter (Signed)
Message left to call and schedule an appointment.Medication can be filled to cover her up to appointment.

## 2018-10-06 ENCOUNTER — Telehealth: Payer: Self-pay | Admitting: Family Medicine

## 2018-10-06 DIAGNOSIS — E039 Hypothyroidism, unspecified: Secondary | ICD-10-CM

## 2018-10-06 MED ORDER — LEVOTHYROXINE SODIUM 88 MCG PO TABS: 88.0000 ug | ORAL_TABLET | Freq: Every day | ORAL | 0 refills | Status: DC

## 2018-10-06 NOTE — Telephone Encounter (Signed)
rx has been sent in ti [facility].

## 2018-10-06 NOTE — Telephone Encounter (Signed)
Copied from [place] [phone]. Topic: General - Other >> Oct 06, 2018 11:57 AM Keene Breath wrote: Reason for CRM: Patient called to schedule appointment for her medication refill.  Appointment was scheduled for 11/03/18.  Patient asked if she could get a refill on levothyroxine (SYNTHROID, LEVOTHROID) 88 MCG tablet to last her until her appointment because she will not have enough pills to last until then.  Please advise.  CB# [phone].

## 2018-11-03 ENCOUNTER — Ambulatory Visit: Payer: BC Managed Care – PPO | Admitting: Family Medicine

## 2018-11-03 ENCOUNTER — Encounter: Payer: Self-pay | Admitting: Family Medicine

## 2018-11-03 ENCOUNTER — Other Ambulatory Visit: Payer: Self-pay

## 2018-11-03 VITALS — BP 122/76 | HR 60 | Temp 98.4°F | Ht 64.0 in | Wt 203.6 lb

## 2018-11-03 DIAGNOSIS — Z13220 Encounter for screening for lipoid disorders: Secondary | ICD-10-CM | POA: Diagnosis not present

## 2018-11-03 DIAGNOSIS — I10 Essential (primary) hypertension: Secondary | ICD-10-CM

## 2018-11-03 DIAGNOSIS — E039 Hypothyroidism, unspecified: Secondary | ICD-10-CM

## 2018-11-03 MED ORDER — HYDROCHLOROTHIAZIDE 12.5 MG PO CAPS: 12.5000 mg | ORAL_CAPSULE | Freq: Every day | ORAL | 1 refills | Status: DC

## 2018-11-03 MED ORDER — HYDROCHLOROTHIAZIDE 12.5 MG PO CAPS: 12.5000 mg | ORAL_CAPSULE | Freq: Every day | ORAL | 0 refills | Status: DC

## 2018-11-03 MED ORDER — LEVOTHYROXINE SODIUM 88 MCG PO TABS: 88.0000 ug | ORAL_TABLET | Freq: Every day | ORAL | 0 refills | Status: DC

## 2018-11-03 MED ORDER — LEVOTHYROXINE SODIUM 88 MCG PO TABS: 88.0000 ug | ORAL_TABLET | Freq: Every day | ORAL | 1 refills | Status: DC

## 2018-11-03 NOTE — Progress Notes (Signed)
Subjective:  By signing my name below, I, Heidi Soto, attest that this documentation has been prepared under the direction and in the presence of Heidi Ray, MD. Electronically Signed: Moises Soto, [place]. 11/03/2018 , 3:56 PM .  Patient was seen in Room 8 .   Patient ID: Heidi Soto, female    DOB: October 16, 1971, 47 y.o.   MRN: [id number] Chief Complaint  Patient presents with  . Medication Refill    [facility] supply    HPI Heidi Soto is a 47 y.o. female Here for medication refill. She is fasting today.   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   HTN She takes HCTZ 12.5 mg qd. She denies any known side effects with her medication. She's missed a few doses of her medications and spaced out her medication, though has been taking her medication in the past 3 days.   BP Readings from Last 3 Encounters:  11/03/18 (!) 158/84  05/05/18 133/90  09/29/17 130/76   Lab Results  Component Value Date   CREATININE 0.86 07/17/2017    Flushed + unsteadiness Patient mentions feeling flushed at times. She also notes feeling a little "stumbly" almost daily, and unsteady for a few seconds. She denies feeling dizzy or room spinning. She has had a headaches about once a week, feeling slightly worse today. She has dropped things from time to time, but denies lost of grip strength. She's able to do step aerobics without any issues. She states urinating often, though drinks plenty of fluid through the day (sometimes up to a gallon a day).   She believes she might be postmenopausal. She is s/p hysterectomy for fibroids. She isn't sure when her mother went through menopause.   She's noticed slight weight gain, due to eating more sweets throughout the day.   Wt Readings from Last 3 Encounters:  11/03/18 203 lb 9.6 oz (92.4 kg)  09/29/17 174 lb 3.2 oz (79 kg)  09/10/17 179 lb 6.4 oz (81.4 kg)    Hypothyroidism Lab Results  Component Value  Date   TSH 5.240 (H) 07/17/2017    Patient Active Problem List   Diagnosis Date Noted  . Obesity, unspecified 03/13/2014  . S/P abdominal hysterectomy 06/30/2013  . H/O Graves' disease 12/30/2012  . Fibroids 12/30/2012   Past Medical History:  Diagnosis Date  . Graves disease   . Headache(784.0)   . Hypertension   . Thyroid disease    Past Surgical History:  Procedure Laterality Date  . ABDOMINAL HYSTERECTOMY N/A 06/29/2013   Procedure: HYSTERECTOMY ABDOMINAL;  Surgeon: Elveria Royals, MD;  Location: [facility];  Service: Gynecology;  Laterality: N/A;  . BILATERAL SALPINGECTOMY Bilateral 06/29/2013   Procedure: BILATERAL SALPINGECTOMY;  Surgeon: Elveria Royals, MD;  Location: [facility];  Service: Gynecology;  Laterality: Bilateral;  . NO PAST SURGERIES     Allergies  Allergen Reactions  . Bactroban [Mupirocin Calcium] Rash   Prior to Admission medications   Medication Sig Start Date End Date Taking? Authorizing Provider  hydrochlorothiazide (MICROZIDE) 12.5 MG capsule Take 1 capsule (12.5 mg total) by mouth daily. 07/27/18   Wendie Agreste, MD  levothyroxine (SYNTHROID, LEVOTHROID) 88 MCG tablet TAKE 1 TABLET DAILY        BEFORE BREAKFAST 07/05/18   Wendie Agreste, MD  levothyroxine (SYNTHROID, LEVOTHROID) 88 MCG tablet Take 1 tablet (88 mcg total) by mouth daily before breakfast. 10/06/18   Wendie Agreste,  MD  meloxicam (MOBIC) 7.5 MG tablet Take 1 tablet (7.5 mg total) by mouth daily. Patient not taking: Reported on 05/05/2018 09/10/17   Wendie Agreste, MD  Multiple Vitamin (MULTIVITAMIN WITH MINERALS) TABS Take 1 tablet by mouth daily. Reported on 05/19/2016    [provider]   Social History   Socioeconomic History  . Marital status: Single    Spouse name: Not on file  . Number of children: Not on file  . Years of education: Not on file  . Highest education level: Not on file  Occupational History  . Not on file  Social Needs  . Financial resource  strain: Not on file  . Food insecurity:    Worry: Not on file    Inability: Not on file  . Transportation needs:    Medical: Not on file    Non-medical: Not on file  Tobacco Use  . Smoking status: Never Smoker  . Smokeless tobacco: Never Used  Substance and Sexual Activity  . Alcohol use: No    Comment: "not often"  . Drug use: No  . Sexual activity: Yes    Partners: Male  Lifestyle  . Physical activity:    Days per week: Not on file    Minutes per session: Not on file  . Stress: Not on file  Relationships  . Social connections:    Talks on phone: Not on file    Gets together: Not on file    Attends religious service: Not on file    Active member of club or organization: Not on file    Attends meetings of clubs or organizations: Not on file    Relationship status: Not on file  . Intimate partner violence:    Fear of current or ex partner: Not on file    Emotionally abused: Not on file    Physically abused: Not on file    Forced sexual activity: Not on file  Other Topics Concern  . Not on file  Social History Narrative  . Not on file   Review of Systems  Constitutional: Negative for fatigue and unexpected weight change.  Respiratory: Negative for chest tightness and shortness of breath.   Cardiovascular: Negative for chest pain, palpitations and leg swelling.  Gastrointestinal: Negative for abdominal pain and Soto in stool.  Neurological: Negative for dizziness, syncope, light-headedness and headaches.       Objective:   Physical Exam  Constitutional: She is oriented to person, place, and time. She appears well-developed and well-nourished.  HENT:  Head: Normocephalic and atraumatic.  Eyes: Pupils are equal, round, and reactive to light. Conjunctivae and EOM are normal. Right eye exhibits no nystagmus. Left eye exhibits no nystagmus.  Neck: Carotid bruit is not present.  Cardiovascular: Normal rate, regular rhythm, normal heart sounds and intact distal pulses.    Pulmonary/Chest: Effort normal and breath sounds normal.  Abdominal: Soft. She exhibits no pulsatile midline mass. There is no tenderness.  Musculoskeletal: She exhibits no edema.  Neurological: She is alert and oriented to person, place, and time. She displays a negative Romberg sign.  No pronator drift, normal heel-shin slide, normal rapid hand alternating, normal heel-to-toe gait, normal finger-to-nose   Skin: Skin is warm and dry.  Psychiatric: She has a normal mood and affect. Her behavior is normal.  Vitals reviewed.   Vitals:   11/03/18 1520 11/03/18 1630  BP: (!) 158/84 122/76  Pulse: 60   Temp: 98.4 F (36.9 C)   TempSrc: Oral  SpO2: 98%   Weight: 203 lb 9.6 oz (92.4 kg)   Height: 5\' 4"  (1.626 m)         Assessment & Plan:   AHUVA POYNOR is a 47 y.o. female Hypothyroidism, unspecified type - Plan: TSH + free T4, levothyroxine (SYNTHROID, LEVOTHROID) 88 MCG tablet, DISCONTINUED: levothyroxine (SYNTHROID, LEVOTHROID) 88 MCG tablet  - check TSH with flushing feeling.  Continue same dose synthroid for now.  Recheck to discuss weight changes, flushing feeling if persistent.   Essential hypertension - Plan: Comprehensive metabolic panel, hydrochlorothiazide (MICROZIDE) 12.5 MG capsule, DISCONTINUED: hydrochlorothiazide (MICROZIDE) 12.5 MG capsule, DISCONTINUED: hydrochlorothiazide (MICROZIDE) 12.5 MG capsule  - ok on recheck,. labs pending. Continue same doses for now, daily dosing and recheck If HA's not improving. RTC/ER precautions.   Screening for hyperlipidemia - Plan: Lipid panel   Meds ordered this encounter  Medications  . DISCONTD: hydrochlorothiazide (MICROZIDE) 12.5 MG capsule    Sig: Take 1 capsule (12.5 mg total) by mouth daily.    Dispense:  90 capsule    Refill:  1  . DISCONTD: hydrochlorothiazide (MICROZIDE) 12.5 MG capsule    Sig: Take 1 capsule (12.5 mg total) by mouth daily.    Dispense:  30 capsule    Refill:  0  . DISCONTD: levothyroxine  (SYNTHROID, LEVOTHROID) 88 MCG tablet    Sig: Take 1 tablet (88 mcg total) by mouth daily before breakfast.    Dispense:  30 tablet    Refill:  0  . hydrochlorothiazide (MICROZIDE) 12.5 MG capsule    Sig: Take 1 capsule (12.5 mg total) by mouth daily.    Dispense:  90 capsule    Refill:  1  . levothyroxine (SYNTHROID, LEVOTHROID) 88 MCG tablet    Sig: Take 1 tablet (88 mcg total) by mouth daily before breakfast.    Dispense:  90 tablet    Refill:  1   Patient Instructions   I will check some labs today, but follow up to discuss flush feelings and weight changes in next 6-8 weeks.   Soto pressure looks better on recheck.  Continue same dose of medication.  If headaches are not improving with taking medication daily or source not found on thyroid testing, please follow-up in the next few weeks to discuss further.  Return to the clinic or go to the nearest emergency room if any of your symptoms worsen or new symptoms occur.   General Headache Without Cause A headache is pain or discomfort felt around the head or neck area. There are many causes and types of headaches. In some cases, the cause may not be found. Follow these instructions at home: Managing pain  Take over-the-counter and prescription medicines only as told by your doctor.  Lie down in a dark, quiet room when you have a headache.  If directed, apply ice to the head and neck area: ? Put ice in a plastic bag. ? Place a towel between your skin and the bag. ? Leave the ice on for 20 minutes, 2-3 times per day.  Use a heating pad or hot shower to apply heat to the head and neck area as told by your doctor.  Keep lights dim if bright lights bother you or make your headaches worse. Eating and drinking  Eat meals on a regular schedule.  Lessen how much alcohol you drink.  Lessen how much caffeine you drink, or stop drinking caffeine. General instructions  Keep all follow-up visits as told by your doctor. This is  important.  Keep a journal to find out if certain things bring on headaches. For example, write down: ? What you eat and drink. ? How much sleep you get. ? Any change to your diet or medicines.  Relax by getting a massage or doing other relaxing activities.  Lessen stress.  Sit up straight. Do not tighten (tense) your muscles.  Do not use tobacco products. This includes cigarettes, chewing tobacco, or e-cigarettes. If you need help quitting, ask your doctor.  Exercise regularly as told by your doctor.  Get enough sleep. This often means 7-9 hours of sleep. Contact a doctor if:  Your symptoms are not helped by medicine.  You have a headache that feels different than the other headaches.  You feel sick to your stomach (nauseous) or you throw up (vomit).  You have a fever. Get help right away if:  Your headache becomes really bad.  You keep throwing up.  You have a stiff neck.  You have trouble seeing.  You have trouble speaking.  You have pain in the eye or ear.  Your muscles are weak or you lose muscle control.  You lose your balance or have trouble walking.  You feel like you will pass out (faint) or you pass out.  You have confusion. This information is not intended to replace advice given to you by your health care provider. Make sure you discuss any questions you have with your health care provider. Document Released: 09/09/2008 Document Revised: 05/08/2016 Document Reviewed: 03/26/2015 Elsevier Interactive Patient Education  [employer].    If you have lab work done today you will be contacted with your lab results within the next 2 weeks.  If you have not heard from [place] then please contact us. The fastest way to get your results is to register for My Chart.   IF you received an x-Soto today, you will receive an invoice from [facility]. Please contact [facility] at [phone] with questions or concerns regarding your invoice.    IF you received labwork today, you will receive an invoice from [place]. Please contact [facility] at [phone] with questions or concerns regarding your invoice.   Our billing staff will not be able to assist you with questions regarding bills from these companies.  You will be contacted with the lab results as soon as they are available. The fastest way to get your results is to activate your My Chart account. Instructions are located on the last page of this paperwork. If you have not heard from [place] regarding the results in 2 weeks, please contact this office.       I personally performed the services described in this documentation, which was scribed in my presence. The recorded information has been reviewed and considered for accuracy and completeness, addended by me as needed, and agree with information above.  Signed,   Heidi Ray, MD [facility].  11/07/18 10:22 PM

## 2018-11-03 NOTE — Patient Instructions (Addendum)
I will check some labs today, but follow up to discuss flush feelings and weight changes in next 6-8 weeks.   Blood pressure looks better on recheck.  Continue same dose of medication.  If headaches are not improving with taking medication daily or source not found on thyroid testing, please follow-up in the next few weeks to discuss further.  Return to the clinic or go to the nearest emergency room if any of your symptoms worsen or new symptoms occur.   General Headache Without Cause A headache is pain or discomfort felt around the head or neck area. There are many causes and types of headaches. In some cases, the cause may not be found. Follow these instructions at home: Managing pain  Take over-the-counter and prescription medicines only as told by your doctor.  Lie down in a dark, quiet room when you have a headache.  If directed, apply ice to the head and neck area: ? Put ice in a plastic bag. ? Place a towel between your skin and the bag. ? Leave the ice on for 20 minutes, 2-3 times per day.  Use a heating pad or hot shower to apply heat to the head and neck area as told by your doctor.  Keep lights dim if bright lights bother you or make your headaches worse. Eating and drinking  Eat meals on a regular schedule.  Lessen how much alcohol you drink.  Lessen how much caffeine you drink, or stop drinking caffeine. General instructions  Keep all follow-up visits as told by your doctor. This is important.  Keep a journal to find out if certain things bring on headaches. For example, write down: ? What you eat and drink. ? How much sleep you get. ? Any change to your diet or medicines.  Relax by getting a massage or doing other relaxing activities.  Lessen stress.  Sit up straight. Do not tighten (tense) your muscles.  Do not use tobacco products. This includes cigarettes, chewing tobacco, or e-cigarettes. If you need help quitting, ask your doctor.  Exercise regularly  as told by your doctor.  Get enough sleep. This often means 7-9 hours of sleep. Contact a doctor if:  Your symptoms are not helped by medicine.  You have a headache that feels different than the other headaches.  You feel sick to your stomach (nauseous) or you throw up (vomit).  You have a fever. Get help right away if:  Your headache becomes really bad.  You keep throwing up.  You have a stiff neck.  You have trouble seeing.  You have trouble speaking.  You have pain in the eye or ear.  Your muscles are weak or you lose muscle control.  You lose your balance or have trouble walking.  You feel like you will pass out (faint) or you pass out.  You have confusion. This information is not intended to replace advice given to you by your health care provider. Make sure you discuss any questions you have with your health care provider. Document Released: 09/09/2008 Document Revised: 05/08/2016 Document Reviewed: 03/26/2015 Elsevier Interactive Patient Education  [employer].    If you have lab work done today you will be contacted with your lab results within the next 2 weeks.  If you have not heard from [place] then please contact us. The fastest way to get your results is to register for My Chart.   IF you received an x-ray today, you will receive an invoice from [facility]. Please contact [facility] at [phone] with questions or concerns regarding your invoice.   IF you received labwork today, you will receive an invoice from [place]. Please contact [facility] at [phone] with questions or concerns regarding your invoice.   Our billing staff will not be able to assist you with questions regarding bills from these companies.  You will be contacted with the lab results as soon as they are available. The fastest way to get your results is to activate your My Chart account. Instructions are located on the last page of this paperwork. If you have  not heard from [place] regarding the results in 2 weeks, please contact this office.

## 2018-11-04 LAB — COMPREHENSIVE METABOLIC PANEL
A/G RATIO: 1.9 (ref 1.2–2.2)
ALT: 16 IU/L (ref 0–32)
AST: 22 IU/L (ref 0–40)
Albumin: 4.3 g/dL (ref 3.5–5.5)
Alkaline Phosphatase: 77 IU/L (ref 39–117)
BUN/Creatinine Ratio: 19 (ref 9–23)
BUN: 16 mg/dL (ref 6–24)
Bilirubin Total: 0.7 mg/dL (ref 0.0–1.2)
CO2: 24 mmol/L (ref 20–29)
CREATININE: 0.84 mg/dL (ref 0.57–1.00)
Calcium: 9.4 mg/dL (ref 8.7–10.2)
Chloride: 97 mmol/L (ref 96–106)
GFR calc Af Amer: 96 mL/min/{1.73_m2} (ref >59)
GFR, EST NON AFRICAN AMERICAN: 84 mL/min/{1.73_m2} (ref >59)
GLOBULIN, TOTAL: 2.3 g/dL (ref 1.5–4.5)
Glucose: 77 mg/dL (ref 65–99)
Potassium: 3.8 mmol/L (ref 3.5–5.2)
SODIUM: 137 mmol/L (ref 134–144)
Total Protein: 6.6 g/dL (ref 6.0–8.5)

## 2018-11-04 LAB — LIPID PANEL
CHOLESTEROL TOTAL: 187 mg/dL (ref 100–199)
Chol/HDL Ratio: 3 ratio (ref 0.0–4.4)
HDL: 62 mg/dL (ref >39)
LDL Calculated: 113 mg/dL — ABNORMAL HIGH (ref 0–99)
TRIGLYCERIDES: 62 mg/dL (ref 0–149)
VLDL CHOLESTEROL CAL: 12 mg/dL (ref 5–40)

## 2018-11-04 LAB — TSH+FREE T4
FREE T4: 1.6 ng/dL (ref 0.82–1.77)
TSH: 3.09 u[IU]/mL (ref 0.450–4.500)

## 2018-11-30 ENCOUNTER — Other Ambulatory Visit: Payer: Self-pay | Admitting: Family Medicine

## 2018-11-30 DIAGNOSIS — E039 Hypothyroidism, unspecified: Secondary | ICD-10-CM

## 2018-11-30 NOTE — Telephone Encounter (Signed)
Courtesy until mail order arrives.

## 2018-11-30 NOTE — Telephone Encounter (Signed)
Patient returned called, she was advised that Levothyroxine was sent to [facility] on 11/03/18 #90/1 refill. She says she never received the medication from [facility] and on that same day, Dr. Carlota Raspberry sent a 30 day supply to [facility]. She says when [facility] automated called to let her know there were no refills, she selected to request the refill because she thought she was receiving from [facility]. I advised I will send another 30 day to [facility] and for her to call [facility] to ask if they received the Rx on 11/03/18. Advised if they did not receive, she could call us back and we will resend it or have [facility] to request, she verbalized understanding.

## 2018-11-30 NOTE — Telephone Encounter (Signed)
Left VM for clarification;  LRF 11/03/18  #90 with refills. Too early for refill request

## 2018-12-28 ENCOUNTER — Ambulatory Visit: Payer: BC Managed Care – PPO | Admitting: Family Medicine

## 2018-12-31 ENCOUNTER — Other Ambulatory Visit: Payer: Self-pay | Admitting: Family Medicine

## 2018-12-31 DIAGNOSIS — E039 Hypothyroidism, unspecified: Secondary | ICD-10-CM

## 2018-12-31 MED ORDER — LEVOTHYROXINE SODIUM 88 MCG PO TABS: ORAL_TABLET | ORAL | 0 refills | Status: DC

## 2018-12-31 NOTE — Telephone Encounter (Signed)
Copied from [place] [id number]. Topic: Quick Communication - Rx Refill/Question >> Dec 31, 2018 10:15 AM Carolyn Stare wrote: Medication levothyroxine (SYNTHROID, LEVOTHROID) 88 MCG tablet    90 day supply   Has the patient contacted their pharmacy yes    Preferred Pharmacy   [facility]   Agent: Please be advised that RX refills may take up to 3 business days. We ask that you follow-up with your pharmacy.

## 2019-01-18 ENCOUNTER — Ambulatory Visit: Payer: BC Managed Care – PPO | Admitting: Family Medicine

## 2019-02-15 ENCOUNTER — Ambulatory Visit: Payer: BC Managed Care – PPO | Admitting: Family Medicine

## 2019-03-20 ENCOUNTER — Other Ambulatory Visit: Payer: Self-pay | Admitting: Family Medicine

## 2019-03-20 DIAGNOSIS — E039 Hypothyroidism, unspecified: Secondary | ICD-10-CM

## 2019-03-25 ENCOUNTER — Telehealth: Payer: Self-pay | Admitting: Family Medicine

## 2019-03-25 NOTE — Telephone Encounter (Signed)
Spoke with pharmacy and oked tabs.

## 2019-03-25 NOTE — Telephone Encounter (Signed)
PLEASE ADV   Copied from [place] [phone]. Topic: General - Other >> Mar 25, 2019 12:07 PM Antonieta Iba C wrote: Reason for CRM: [facility] pharmacy called in to be advised. Pt is requesting refill for hydrochlorothiazide (MICROZIDE) 12.5 MG capsule. Pharmacy says that capsules are on back order and would like to know if provider is okay with tablets instead.   CB: [phone] - select opt 2 with ref# [id number]

## 2019-03-26 NOTE — Telephone Encounter (Signed)
Ok to supplement tablets.

## 2019-03-29 ENCOUNTER — Telehealth (INDEPENDENT_AMBULATORY_CARE_PROVIDER_SITE_OTHER): Payer: BC Managed Care – PPO | Admitting: Family Medicine

## 2019-03-29 ENCOUNTER — Other Ambulatory Visit: Payer: Self-pay

## 2019-03-29 DIAGNOSIS — E039 Hypothyroidism, unspecified: Secondary | ICD-10-CM

## 2019-03-29 DIAGNOSIS — M25511 Pain in right shoulder: Secondary | ICD-10-CM

## 2019-03-29 DIAGNOSIS — L679 Hair color and hair shaft abnormality, unspecified: Secondary | ICD-10-CM

## 2019-03-29 DIAGNOSIS — I10 Essential (primary) hypertension: Secondary | ICD-10-CM

## 2019-03-29 MED ORDER — HYDROCHLOROTHIAZIDE 12.5 MG PO CAPS: 12.5000 mg | ORAL_CAPSULE | Freq: Every day | ORAL | 1 refills | Status: DC

## 2019-03-29 NOTE — Progress Notes (Signed)
CC- f/u Blood pressure and weight changes- Home Blood pressure today right arm-147/93 left arm 137/78. Weight has been going back up and not losing weight as easy as I did before. Maybe mentalpause  but not sure. Right shoulder is bothering me when lifting weights and certain movements.

## 2019-03-29 NOTE — Patient Instructions (Addendum)
Contact Dr. Terrence Dupont to discuss variation in blood pressures from the arms. No change in meds for now.   Shoulder pain could be from muscles, such as rotator cuff. Tylenol ok for now, but schedule visit in next month if possible to evaluate that further.   Call to schedule lab only visit for thyroid testing in next week.  Return to the clinic or go to the nearest emergency room if any of your symptoms worsen or new symptoms occur.   Shoulder Pain Many things can cause shoulder pain, including:  An injury to the shoulder.  Overuse of the shoulder.  Arthritis. The source of the pain can be:  Inflammation.  An injury to the shoulder joint.  An injury to a tendon, ligament, or bone. Follow these instructions at home: Pay attention to changes in your symptoms. Let your health care provider know about them. Follow these instructions to relieve your pain. If you have a sling:  Wear the sling as told by your health care provider. Remove it only as told by your health care provider.  Loosen the sling if your fingers tingle, become numb, or turn cold and blue.  Keep the sling clean.  If the sling is not waterproof: ? Do not let it get wet. Remove it to shower or bathe.  Move your arm as little as possible, but keep your hand moving to prevent swelling. Managing pain, stiffness, and swelling   If directed, put ice on the painful area: ? Put ice in a plastic bag. ? Place a towel between your skin and the bag. ? Leave the ice on for 20 minutes, 2-3 times per day. Stop applying ice if it does not help with the pain.  Squeeze a soft ball or a foam pad as much as possible. This helps to keep the shoulder from swelling. It also helps to strengthen the arm. General instructions  Take over-the-counter and prescription medicines only as told by your health care provider.  Keep all follow-up visits as told by your health care provider. This is important. Contact a health care provider  if:  Your pain gets worse.  Your pain is not relieved with medicines.  New pain develops in your arm, hand, or fingers. Get help right away if:  Your arm, hand, or fingers: ? Tingle. ? Become numb. ? Become swollen. ? Become painful. ? Turn white or blue. Summary  Shoulder pain can be caused by an injury, overuse, or arthritis.  Pay attention to changes in your symptoms. Let your health care provider know about them.  This condition may be treated with a sling, ice, and pain medicines.  Contact your health care provider if the pain gets worse or new pain develops. Get help right away if your arm, hand, or fingers tingle or become numb, swollen, or painful.  Keep all follow-up visits as told by your health care provider. This is important. This information is not intended to replace advice given to you by your health care provider. Make sure you discuss any questions you have with your health care provider. Document Released: 09/10/2005 Document Revised: 06/15/2018 Document Reviewed: 06/15/2018 Elsevier Interactive Patient Education  [employer].       If you have lab work done today you will be contacted with your lab results within the next 2 weeks.  If you have not heard from [place] then please contact us. The fastest way to get your results is to register for My Chart.   IF  you received an x-ray today, you will receive an invoice from [facility]. Please contact [facility] at [phone] with questions or concerns regarding your invoice.   IF you received labwork today, you will receive an invoice from [place]. Please contact [facility] at [phone] with questions or concerns regarding your invoice.   Our billing staff will not be able to assist you with questions regarding bills from these companies.  You will be contacted with the lab results as soon as they are available. The fastest way to get your results is to activate your My Chart  account. Instructions are located on the last page of this paperwork. If you have not heard from [place] regarding the results in 2 weeks, please contact this office.

## 2019-03-29 NOTE — Progress Notes (Signed)
Virtual Visit via Telephone Note  I connected with Heidi Soto on 03/29/19 at 5:21 PM by telephone and verified that I am speaking with the correct person using two identifiers.   I discussed the limitations, risks, security and privacy concerns of performing an evaluation and management service by telephone and the availability of in person appointments. I also discussed with the patient that there may be a patient responsible charge related to this service. The patient expressed understanding and agreed to proceed, consent obtained  Chief complaint: Blood pressure Weight changes Shoulder pain  History of Present Illness: Hypertension: BP Readings from Last 3 Encounters:  11/03/18 122/76  05/05/18 133/90  09/29/17 130/76   Lab Results  Component Value Date   CREATININE 0.84 11/03/2018  Currently on HCTZ 12.5 mg daily. Exercising, teaching aerobics classes.  Home readings: R arm 147/93, left arm 137/78 today.  R arm usually higher.  Seen by cardiology - Dr. Terrence Dupont for heart murmur. Not sure if he compared two arm BP's. Had other testing that was ok.  Constitutional: Negative for fatigue and unexpected weight change - no changes since last visit. Fluctuates up and down by 5 pounds. In class for diabetes prevention.  Cardiovascular: Negative for chest pain, Neurological: Negative for dizziness, light-headedness and headaches.     R shoulder pain: R shoulder sore at times for years. More sore to sleep on area past year. Not bothering during the day. Notes more with certain exercises only.  Has not had evaluated prior. ROM over past 2 years different, but not recently changed.  Tx: acetaminophen.   Hypothyroidism: Lab Results  Component Value Date   TSH 3.090 11/03/2018  Synthroid 88 mcg daily. Wt Readings from Last 3 Encounters:  11/03/18 203 lb 9.6 oz (92.4 kg)  09/29/17 174 lb 3.2 oz (79 kg)  09/10/17 179 lb 6.4 oz (81.4 kg)  Had noted some weight gain in November  from previous year.  Initially was stopped from dietary changes. Was having some flushing feelings and weight changes discussed at that visit with plan to follow-up in 6 to 8 weeks.  This is first visit since November 2019 No recent weight changes. Seems like increased hair coming out since last visit - may have been after last appt. Some flushes - but thinks menopausal.     Patient Active Problem List   Diagnosis Date Noted  . Obesity, unspecified 03/13/2014  . S/P abdominal hysterectomy 06/30/2013  . H/O Graves' disease 12/30/2012  . Fibroids 12/30/2012   Past Medical History:  Diagnosis Date  . Graves disease   . Headache(784.0)   . Hypertension   . Thyroid disease    Past Surgical History:  Procedure Laterality Date  . ABDOMINAL HYSTERECTOMY N/A 06/29/2013   Procedure: HYSTERECTOMY ABDOMINAL;  Surgeon: Elveria Royals, MD;  Location: [facility];  Service: Gynecology;  Laterality: N/A;  . BILATERAL SALPINGECTOMY Bilateral 06/29/2013   Procedure: BILATERAL SALPINGECTOMY;  Surgeon: Elveria Royals, MD;  Location: [facility];  Service: Gynecology;  Laterality: Bilateral;  . NO PAST SURGERIES     Allergies  Allergen Reactions  . Bactroban [Mupirocin Calcium] Rash   Prior to Admission medications   Medication Sig Start Date End Date Taking? Authorizing Provider  hydrochlorothiazide (MICROZIDE) 12.5 MG capsule Take 1 capsule (12.5 mg total) by mouth daily. 11/03/18  Yes Wendie Agreste, MD  levothyroxine (SYNTHROID) 88 MCG tablet TAKE 1 TABLET DAILY BEFORE BREAKFAST 03/20/19  Yes Wendie Agreste, MD  Multiple Vitamin (MULTIVITAMIN  WITH MINERALS) TABS Take 1 tablet by mouth daily. Reported on 05/19/2016   Yes [provider]   Social History   Socioeconomic History  . Marital status: Single    Spouse name: Not on file  . Number of children: Not on file  . Years of education: Not on file  . Highest education level: Not on file  Occupational History  . Not on file  Social  Needs  . Financial resource strain: Not on file  . Food insecurity:    Worry: Not on file    Inability: Not on file  . Transportation needs:    Medical: Not on file    Non-medical: Not on file  Tobacco Use  . Smoking status: Never Smoker  . Smokeless tobacco: Never Used  Substance and Sexual Activity  . Alcohol use: No    Comment: "not often"  . Drug use: No  . Sexual activity: Yes    Partners: Male  Lifestyle  . Physical activity:    Days per week: Not on file    Minutes per session: Not on file  . Stress: Not on file  Relationships  . Social connections:    Talks on phone: Not on file    Gets together: Not on file    Attends religious service: Not on file    Active member of club or organization: Not on file    Attends meetings of clubs or organizations: Not on file    Relationship status: Not on file  . Intimate partner violence:    Fear of current or ex partner: Not on file    Emotionally abused: Not on file    Physically abused: Not on file    Forced sexual activity: Not on file  Other Topics Concern  . Not on file  Social History Narrative  . Not on file     Observations/Objective:  No distress.   Assessment and Plan: Hypothyroidism, unspecified type - Plan: TSH + free T4 Hair changes - Plan: TSH + free T4  -With change in hair loss, flushing will repeat thyroid testing as she may have noticed that since normal testing in November.  No change in Synthroid at this time.  This can be done as a lab only visit.  Essential hypertension - Plan: hydrochlorothiazide (MICROZIDE) 12.5 MG capsule  -Borderline with some variability of right versus left arm.  She is barely at the 15 mm difference diastolic readings right versus left.  No known history of peripheral arterial disease in the lower extremities.  She is not giving typical claudication or exertional pain symptoms in the arm or shoulder, less likely PAD, but did recommend she discuss with cardiology to determine  if other testing needed.  Potentially would want to recheck blood pressures individually in each arm either through cardiology office or our office.  No med changes at this time.  Pain in joint of right shoulder  -Description sounds like possible rotator cuff tendinosis versus degenerative changes within the joint.  Could be component of bursitis as well.  Longstanding symptoms without acute changes recently, plans for follow-up office visit in person once some of the restrictions with COVID-19 pandemic have been lifted for further evaluation, and possible imaging.  RTC precautions if worsening.    Follow Up Instructions:  Lab visit in next week, OV for shoulder in next month.    I discussed the assessment and treatment plan with the patient. The patient was provided an opportunity to ask questions and  all were answered. The patient agreed with the plan and demonstrated an understanding of the instructions.   The patient was advised to call back or seek an in-person evaluation if the symptoms worsen or if the condition fails to improve as anticipated.  I provided 27 minutes of non-face-to-face time during this encounter.  Signed,   Merri Ray, MD [facility].  03/29/19

## 2019-05-02 ENCOUNTER — Telehealth: Payer: BC Managed Care – PPO | Admitting: Family Medicine

## 2019-05-03 ENCOUNTER — Ambulatory Visit (INDEPENDENT_AMBULATORY_CARE_PROVIDER_SITE_OTHER): Payer: BC Managed Care – PPO | Admitting: Family Medicine

## 2019-05-03 ENCOUNTER — Other Ambulatory Visit: Payer: Self-pay

## 2019-05-03 ENCOUNTER — Encounter: Payer: Self-pay | Admitting: Family Medicine

## 2019-05-03 ENCOUNTER — Ambulatory Visit: Payer: BC Managed Care – PPO | Admitting: Family Medicine

## 2019-05-03 VITALS — BP 150/88 | HR 60 | Temp 98.5°F | Resp 18 | Ht 64.0 in | Wt 206.0 lb

## 2019-05-03 DIAGNOSIS — M79601 Pain in right arm: Secondary | ICD-10-CM

## 2019-05-03 DIAGNOSIS — M25511 Pain in right shoulder: Secondary | ICD-10-CM

## 2019-05-03 DIAGNOSIS — M5412 Radiculopathy, cervical region: Secondary | ICD-10-CM

## 2019-05-03 DIAGNOSIS — E039 Hypothyroidism, unspecified: Secondary | ICD-10-CM

## 2019-05-03 DIAGNOSIS — I10 Essential (primary) hypertension: Secondary | ICD-10-CM

## 2019-05-03 DIAGNOSIS — L679 Hair color and hair shaft abnormality, unspecified: Secondary | ICD-10-CM

## 2019-05-03 MED ORDER — TRAMADOL HCL 50 MG PO TABS: 50.0000 mg | ORAL_TABLET | Freq: Three times a day (TID) | ORAL | 0 refills | Status: AC | PRN

## 2019-05-03 MED ORDER — MELOXICAM 7.5 MG PO TABS: 7.5000 mg | ORAL_TABLET | Freq: Every day | ORAL | 0 refills | Status: DC

## 2019-05-03 NOTE — Patient Instructions (Addendum)
  Try meloxicam for arm and shoulder symptoms.  I suspect you have a combination of things that may be occurring including possible pinched nerve from the neck, possible arthritis or irritation of the [facility] joint and possible rotator cuff discomfort.  Recheck in 2 weeks, but will have x-rays ordered prior to that time.  Return sooner if worse.  I will check thyroid test from today and can adjust medications if needed  Please call cardiology to discuss the blood pressure variability, but continue to monitor your blood pressure at home.  If that is remaining over 140/90 this week, let me know and we can adjust medications.  Return to the clinic or go to the nearest emergency room if any of your symptoms worsen or new symptoms occur.   If you have lab work done today you will be contacted with your lab results within the next 2 weeks.  If you have not heard from [place] then please contact us. The fastest way to get your results is to register for My Chart.   IF you received an x-ray today, you will receive an invoice from [facility]. Please contact [facility] Radiology at [phone] with questions or concerns regarding your invoice.   IF you received labwork today, you will receive an invoice from [facility]. Please contact [facility] at [phone] with questions or concerns regarding your invoice.   Our billing staff will not be able to assist you with questions regarding bills from these companies.  You will be contacted with the lab results as soon as they are available. The fastest way to get your results is to activate your My Chart account. Instructions are located on the last page of this paperwork. If you have not heard from [place] regarding the results in 2 weeks, please contact this office.

## 2019-05-03 NOTE — Progress Notes (Signed)
Subjective:    Patient ID: Heidi Soto, female    DOB: 09-Aug-1971, 48 y.o.   MRN: [id number]  HPI Heidi Soto is a 48 y.o. female Presents today for: Chief Complaint  Patient presents with  . Shoulder Pain    right shoulder pain throbbing down arm and worse at night. xabout a year    Right shoulder/arm pain: Present for approximately 1 year Discussed in April telemedicine visit.  Noticed more insulin exercises that time.  Noticed possible change in range of motion over the prior 2 years but no recent changes, can use acetaminophen episodically.   Now seems to be worse - waking up at night. Tingling down to hand at times and numb - last week. Numbness,tingling improved, but still pain - throbbing down arm - upper arm an into shoulder blade.  Neck not painful.  Has tried plastic ball to upper back - feels better. Occasional tylenol.  R hand dominant. Certain movements or exercises make worse - deltoid, planks motion. Worse with full arm motion.    Hypothyroidism: Lab Results  Component Value Date   TSH 3.090 11/03/2018  some increased hair thinning, dry hair. Some weight gain with the pandemic, then improved with intermittent fasting. tsh obtained today.  Synthroid 41mcg QD.    Hypertension: BP Readings from Last 3 Encounters:  05/03/19 (!) 150/88  11/03/18 122/76  05/05/18 133/90   Lab Results  Component Value Date   CREATININE 0.84 11/03/2018  some increased stressors.  Home readings - R arm 140/80 yesterday, 123/70 on left.  Still plans on follow up with cardiology.    Patient Active Problem List   Diagnosis Date Noted  . Obesity, unspecified 03/13/2014  . S/P abdominal hysterectomy 06/30/2013  . H/O Graves' disease 12/30/2012  . Fibroids 12/30/2012   Past Medical History:  Diagnosis Date  . Graves disease   . Headache(784.0)   . Hypertension   . Thyroid disease    Past Surgical History:  Procedure Laterality Date  . ABDOMINAL HYSTERECTOMY N/A  06/29/2013   Procedure: HYSTERECTOMY ABDOMINAL;  Surgeon: Elveria Royals, MD;  Location: [facility];  Service: Gynecology;  Laterality: N/A;  . BILATERAL SALPINGECTOMY Bilateral 06/29/2013   Procedure: BILATERAL SALPINGECTOMY;  Surgeon: Elveria Royals, MD;  Location: [facility];  Service: Gynecology;  Laterality: Bilateral;  . NO PAST SURGERIES     Allergies  Allergen Reactions  . Bactroban [Mupirocin Calcium] Rash   Prior to Admission medications   Medication Sig Start Date End Date Taking? Authorizing Provider  hydrochlorothiazide (MICROZIDE) 12.5 MG capsule Take 1 capsule (12.5 mg total) by mouth daily. 03/29/19  Yes Wendie Agreste, MD  levothyroxine (SYNTHROID) 88 MCG tablet TAKE 1 TABLET DAILY BEFORE BREAKFAST 03/20/19  Yes Wendie Agreste, MD  Multiple Vitamin (MULTIVITAMIN WITH MINERALS) TABS Take 1 tablet by mouth daily. Reported on 05/19/2016   Yes [provider]   Social History   Socioeconomic History  . Marital status: Single    Spouse name: Not on file  . Number of children: Not on file  . Years of education: Not on file  . Highest education level: Not on file  Occupational History  . Not on file  Social Needs  . Financial resource strain: Not on file  . Food insecurity:    Worry: Not on file    Inability: Not on file  . Transportation needs:    Medical: Not on file    Non-medical: Not on file  Tobacco  Use  . Smoking status: Never Smoker  . Smokeless tobacco: Never Used  Substance and Sexual Activity  . Alcohol use: No    Comment: "not often"  . Drug use: No  . Sexual activity: Yes    Partners: Male  Lifestyle  . Physical activity:    Days per week: Not on file    Minutes per session: Not on file  . Stress: Not on file  Relationships  . Social connections:    Talks on phone: Not on file    Gets together: Not on file    Attends religious service: Not on file    Active member of club or organization: Not on file    Attends meetings of clubs or  organizations: Not on file    Relationship status: Not on file  . Intimate partner violence:    Fear of current or ex partner: Not on file    Emotionally abused: Not on file    Physically abused: Not on file    Forced sexual activity: Not on file  Other Topics Concern  . Not on file  Social History Narrative  . Not on file    Review of Systems Per HPI.     Objective:   Physical Exam Vitals signs and nursing note reviewed.  Constitutional:      Appearance: She is well-developed.  HENT:     Head: Normocephalic and atraumatic.  Eyes:     Conjunctiva/sclera: Conjunctivae normal.     Pupils: Pupils are equal, round, and reactive to light.  Neck:     Vascular: No carotid bruit.  Cardiovascular:     Rate and Rhythm: Normal rate and regular rhythm.     Heart sounds: Normal heart sounds.  Pulmonary:     Effort: Pulmonary effort is normal.     Breath sounds: Normal breath sounds.  Abdominal:     Palpations: Abdomen is soft. There is no pulsatile mass.     Tenderness: There is no abdominal tenderness.  Musculoskeletal:     Right shoulder: She exhibits bony tenderness (Tenderness over right AC, diffuse discomfort over right upper arm, but no focal bony tenderness.). She exhibits normal range of motion and normal strength.     Cervical back: She exhibits decreased range of motion (Minimal decreased extension, minimal decreased right greater than left rotation, right lateral flexion versus left lateral flexion, slight discomfort into her right arm with right rotation). She exhibits no tenderness, no bony tenderness and no swelling.     Comments: Right shoulder, full range of motion, full RTC strength, negative Hawkins, negative Neer, slight discomfort with crossover.  Upper extremity strength equal.    Skin:    General: Skin is warm and dry.  Neurological:     Mental Status: She is alert and oriented to person, place, and time.     Comments: 2+ biceps, triceps, brachioradialis  bilaterally, neurovascular intact distally with cap refill less than 1 second fingertips, hand veins flat with arm elevation  Psychiatric:        Behavior: Behavior normal.    Vitals:   05/03/19 1044 05/03/19 1100  BP: (!) 168/97 (!) 150/88  Pulse: 60   Resp: 18   Temp: 98.5 F (36.9 C)   TempSrc: Oral   SpO2: 100%   Weight: 206 lb (93.4 kg)   Height: 5\' 4"  (1.626 m)        Assessment & Plan:   LAVERDA STRIBLING is a 48 y.o. female Pain in joint  of right shoulder - Plan: meloxicam (MOBIC) 7.5 MG tablet, traMADol (ULTRAM) 50 MG tablet, DG Shoulder Right Arthralgia of right acromioclavicular joint - Plan: meloxicam (MOBIC) 7.5 MG tablet, traMADol (ULTRAM) 50 MG tablet, DG Shoulder Right Cervical radiculopathy - Plan: meloxicam (MOBIC) 7.5 MG tablet, traMADol (ULTRAM) 50 MG tablet, DG Cervical Spine 2 or 3 views Right arm pain - Plan: meloxicam (MOBIC) 7.5 MG tablet, traMADol (ULTRAM) 50 MG tablet, DG Shoulder Right  -Possible multiple contributors of right arm/shoulder symptoms. some symptoms of radiculopathy, and also tender over acromioclavicular joint.  -Imaging ordered, start meloxicam 7.5 mg daily as needed, with tramadol as needed at night or for more severe symptoms, side effects discussed.  Recheck next 2 weeks, sooner if worse  Hypothyroidism, unspecified type  -TSH ordered, can adjust based on reading  Essential hypertension  -Still some variability, plans on follow-up with cardiology, but if persistent elevated readings over the next few weeks, may need to adjust regimen.  Meds ordered this encounter  Medications  . meloxicam (MOBIC) 7.5 MG tablet    Sig: Take 1 tablet (7.5 mg total) by mouth daily.    Dispense:  30 tablet    Refill:  0  . traMADol (ULTRAM) 50 MG tablet    Sig: Take 1 tablet (50 mg total) by mouth every 8 (eight) hours as needed for up to 5 days.    Dispense:  15 tablet    Refill:  0   Patient Instructions    Try meloxicam for arm and shoulder  symptoms.  I suspect you have a combination of things that may be occurring including possible pinched nerve from the neck, possible arthritis or irritation of the [facility] joint and possible rotator cuff discomfort.  Recheck in 2 weeks, but will have x-rays ordered prior to that time.  Return sooner if worse.  I will check thyroid test from today and can adjust medications if needed  Please call cardiology to discuss the blood pressure variability, but continue to monitor your blood pressure at home.  If that is remaining over 140/90 this week, let me know and we can adjust medications.  Return to the clinic or go to the nearest emergency room if any of your symptoms worsen or new symptoms occur.   If you have lab work done today you will be contacted with your lab results within the next 2 weeks.  If you have not heard from [place] then please contact us. The fastest way to get your results is to register for My Chart.   IF you received an x-ray today, you will receive an invoice from [facility] Radiology. Please contact [facility] Radiology at [phone] with questions or concerns regarding your invoice.   IF you received labwork today, you will receive an invoice from [place]. Please contact [facility] at [phone] with questions or concerns regarding your invoice.   Our billing staff will not be able to assist you with questions regarding bills from these companies.  You will be contacted with the lab results as soon as they are available. The fastest way to get your results is to activate your My Chart account. Instructions are located on the last page of this paperwork. If you have not heard from [place] regarding the results in 2 weeks, please contact this office.      Signed,   Merri Ray, MD [facility].  05/05/19 9:19 PM

## 2019-05-04 LAB — TSH+FREE T4
Free T4: 1.82 ng/dL — ABNORMAL HIGH (ref 0.82–1.77)
TSH: 4.34 u[IU]/mL (ref 0.450–4.500)

## 2019-05-17 ENCOUNTER — Ambulatory Visit: Payer: BC Managed Care – PPO | Admitting: Family Medicine

## 2019-05-19 ENCOUNTER — Ambulatory Visit
Admission: RE | Admit: 2019-05-19 | Discharge: 2019-05-19 | Disposition: A | Discharge status: Home / Self Care | Payer: BC Managed Care – PPO | Source: Ambulatory Visit | Attending: Family Medicine | Admitting: Family Medicine

## 2019-05-19 DIAGNOSIS — M79601 Pain in right arm: Secondary | ICD-10-CM

## 2019-05-19 DIAGNOSIS — M5412 Radiculopathy, cervical region: Secondary | ICD-10-CM

## 2019-05-19 DIAGNOSIS — M25511 Pain in right shoulder: Secondary | ICD-10-CM

## 2019-05-24 ENCOUNTER — Encounter: Payer: Self-pay | Admitting: Family Medicine

## 2019-05-24 ENCOUNTER — Other Ambulatory Visit: Payer: Self-pay

## 2019-05-24 ENCOUNTER — Ambulatory Visit: Payer: BC Managed Care – PPO | Admitting: Family Medicine

## 2019-05-24 VITALS — BP 104/76 | HR 96 | Temp 98.4°F | Resp 14 | Wt 204.0 lb

## 2019-05-24 DIAGNOSIS — I10 Essential (primary) hypertension: Secondary | ICD-10-CM

## 2019-05-24 DIAGNOSIS — M25511 Pain in right shoulder: Secondary | ICD-10-CM | POA: Diagnosis not present

## 2019-05-24 NOTE — Patient Instructions (Addendum)
   Please follow-up with cardiology as planned.  Blood pressure looks okay today.   Shoulder exam is reassuring today.  Based on x-rays and evaluation in office, I think that some of the symptoms could still be coming from your neck.  Meloxicam once per day can be tried temporarily to see if that will help, but will also refer you to orthopedic specialist for evaluation and deciding on other imaging versus physical therapy if needed.  Thanks for coming in today, take care.  Return to the clinic or go to the nearest emergency room if any of your symptoms worsen or new symptoms occur.   If you have lab work done today you will be contacted with your lab results within the next 2 weeks.  If you have not heard from [place] then please contact us. The fastest way to get your results is to register for My Chart.   IF you received an x-ray today, you will receive an invoice from [facility]. Please contact [facility] Radiology at [phone] with questions or concerns regarding your invoice.   IF you received labwork today, you will receive an invoice from [place] [facility]. Please contact [facility] at [phone] with questions or concerns regarding your invoice.   Our billing staff will not be able to assist you with questions regarding bills from these companies.  You will be contacted with the lab results as soon as they are available. The fastest way to get your results is to activate your My Chart account. Instructions are located on the last page of this paperwork. If you have not heard from [place] regarding the results in 2 weeks, please contact this office.

## 2019-05-24 NOTE — Progress Notes (Signed)
Subjective:    Patient ID: Heidi Soto, female    DOB: 06/15/1971, 48 y.o.   MRN: [id number]  HPI Heidi Soto is a 48 y.o. female Presents today for: Chief Complaint  Patient presents with  . Shoulder Pain    patient was seen here on 05/03/19 with shoulder pain here for a f/u on right shoulder. was put on mobic nad trmadol for the shoulder pain. But I did not take the meds I wanted to just tryn to rest it first to see how thst do. Shoulder is a little better but still sore  . Hypertension    Patient did not see Cardiology as of yet. Have an up coming friday 05/27/19. Blood pressure has been running low. Was told from last visit if blood pressure keep running over 140/90 then will look into adjusting blood pressure med    R shoulder pain: See last visit regarding recurrent pain of right shoulder.  Thought to have possible contributors including possible radiculopathy from cervical spine, but also some discomfort over acromioclavicular joint.  Meloxicam 7.5 mg/day was started with option of tramadol for more severe symptoms.  X-ray of shoulder without concerning findings, some degenerative changes noted in cervical spine.  Did not take meloxicam or tramadol (wanted to avoid meds).. Less sore with ice/heat, and decreasing weight lifting.   Dg Cervical Spine 2 Or 3 Views  Result Date: 05/19/2019 CLINICAL DATA:  Pain EXAM: CERVICAL SPINE - 2-3 VIEW COMPARISON:  None. FINDINGS: There is straightening of the normal cervical lordotic curvature. There is disc height loss at the C4-C5 and C5-C6 levels. There is no prevertebral soft tissue swelling. There is no displaced fracture. No dislocation. IMPRESSION: 1. No acute displaced fracture. 2. Degenerative changes as above. Electronically Signed   By: Constance Holster M.D.   On: 05/19/2019 21:41   Dg Shoulder Right  Result Date: 05/19/2019 CLINICAL DATA:  Chronic right shoulder pain. EXAM: RIGHT SHOULDER - 2+ VIEW COMPARISON:  None. FINDINGS:  There is no evidence of fracture or dislocation. There is no evidence of arthropathy or other focal bone abnormality. Soft tissues are unremarkable. IMPRESSION: Negative. Electronically Signed   By: Constance Holster M.D.   On: 05/19/2019 21:41    Hypertension: BP Readings from Last 3 Encounters:  05/03/19 (!) 150/88  11/03/18 122/76  05/05/18 133/90   Lab Results  Component Value Date   CREATININE 0.84 11/03/2018  Continued on same regimen last visit although some variability in readings.  Has follow-up with cardiology in a few days to evaluate blood pressure as well as variation on right versus left arm blood pressures. Prior to 5 days ago - 116/70's. Readings right to left were similar at home.    Patient Active Problem List   Diagnosis Date Noted  . Obesity, unspecified 03/13/2014  . S/P abdominal hysterectomy 06/30/2013  . H/O Graves' disease 12/30/2012  . Fibroids 12/30/2012   Past Medical History:  Diagnosis Date  . Graves disease   . Headache(784.0)   . Hypertension   . Thyroid disease    Past Surgical History:  Procedure Laterality Date  . ABDOMINAL HYSTERECTOMY N/A 06/29/2013   Procedure: HYSTERECTOMY ABDOMINAL;  Surgeon: Elveria Royals, MD;  Location: [facility];  Service: Gynecology;  Laterality: N/A;  . BILATERAL SALPINGECTOMY Bilateral 06/29/2013   Procedure: BILATERAL SALPINGECTOMY;  Surgeon: Elveria Royals, MD;  Location: [facility];  Service: Gynecology;  Laterality: Bilateral;  . NO PAST SURGERIES     Allergies  Allergen  Reactions  . Bactroban [Mupirocin Calcium] Rash   Prior to Admission medications   Medication Sig Start Date End Date Taking? Authorizing Provider  hydrochlorothiazide (MICROZIDE) 12.5 MG capsule Take 1 capsule (12.5 mg total) by mouth daily. 03/29/19  Yes Wendie Agreste, MD  levothyroxine (SYNTHROID) 88 MCG tablet TAKE 1 TABLET DAILY BEFORE BREAKFAST 03/20/19  Yes Wendie Agreste, MD  meloxicam (MOBIC) 7.5 MG tablet Take 1 tablet (7.5 mg  total) by mouth daily. 05/03/19  Yes Wendie Agreste, MD  Multiple Vitamin (MULTIVITAMIN WITH MINERALS) TABS Take 1 tablet by mouth daily. Reported on 05/19/2016   Yes [provider]   Social History   Socioeconomic History  . Marital status: Single    Spouse name: Not on file  . Number of children: Not on file  . Years of education: Not on file  . Highest education level: Not on file  Occupational History  . Not on file  Social Needs  . Financial resource strain: Not on file  . Food insecurity:    Worry: Not on file    Inability: Not on file  . Transportation needs:    Medical: Not on file    Non-medical: Not on file  Tobacco Use  . Smoking status: Never Smoker  . Smokeless tobacco: Never Used  Substance and Sexual Activity  . Alcohol use: No    Comment: "not often"  . Drug use: No  . Sexual activity: Yes    Partners: Male  Lifestyle  . Physical activity:    Days per week: Not on file    Minutes per session: Not on file  . Stress: Not on file  Relationships  . Social connections:    Talks on phone: Not on file    Gets together: Not on file    Attends religious service: Not on file    Active member of club or organization: Not on file    Attends meetings of clubs or organizations: Not on file    Relationship status: Not on file  . Intimate partner violence:    Fear of current or ex partner: Not on file    Emotionally abused: Not on file    Physically abused: Not on file    Forced sexual activity: Not on file  Other Topics Concern  . Not on file  Social History Narrative  . Not on file    Review of Systems  Constitutional: Negative for fatigue and unexpected weight change.  Respiratory: Negative for chest tightness and shortness of breath.   Cardiovascular: Negative for chest pain, palpitations and leg swelling.  Gastrointestinal: Negative for abdominal pain and blood in stool.  Musculoskeletal: Positive for arthralgias (R shoulder, ).   Neurological: Negative for dizziness, syncope, light-headedness and headaches.       Objective:   Physical Exam Vitals signs reviewed.  Constitutional:      Appearance: She is well-developed.  HENT:     Head: Normocephalic and atraumatic.  Eyes:     Conjunctiva/sclera: Conjunctivae normal.     Pupils: Pupils are equal, round, and reactive to light.  Neck:     Vascular: No carotid bruit.  Cardiovascular:     Rate and Rhythm: Normal rate and regular rhythm.     Heart sounds: Normal heart sounds.  Pulmonary:     Effort: Pulmonary effort is normal.     Breath sounds: Normal breath sounds.  Abdominal:     Palpations: Abdomen is soft. There is no pulsatile mass.  Tenderness: There is no abdominal tenderness.  Musculoskeletal:     Right shoulder: She exhibits normal range of motion (Equal range of motion to the left, full rotator cuff strength testing, negative impingement signs of Neer's or Hawkins.  AC/Irwin/clavicle nontender on exam at present.), no tenderness, no bony tenderness, normal pulse and normal strength.     Cervical back: She exhibits decreased range of motion (Decreased rotation, lateral flexion.  Slight discomfort in the upper paraspinals/trapezius area on the right with right rotation, no reports of radiation down the arm.). She exhibits no tenderness, no bony tenderness, no swelling, no edema and no spasm.  Skin:    General: Skin is warm and dry.  Neurological:     Mental Status: She is alert and oriented to person, place, and time.  Psychiatric:        Behavior: Behavior normal.    Vitals:   05/24/19 1131  BP: 104/76  Pulse: 96  Resp: 14  Temp: 98.4 F (36.9 C)  TempSrc: Oral  SpO2: 100%  Weight: 204 lb (92.5 kg)             Assessment & Plan:  Heidi Soto is a 48 y.o. female Pain in joint of right shoulder - Plan: Ambulatory referral to Orthopedic Surgery  -Thought to have possible multiple causes of shoulder pain including cervical/cervical  radiculopathy, acromioclavicular, possible overuse component or tendinopathy.  Current exam is reassuring with normal strength, normal range of motion of shoulder and no bony tenderness.  Cervical spine imaging and exam still suspicious for component of radiculopathy or cervical spine source.  -We will refer to orthopedics for further evaluation and decision on physical therapy versus advanced imaging.  -Did recommend trial of the meloxicam that was prescribed last time as it may also help cervical issues, side effects and risks discussed so we will try to use short-term.  Has tramadol if needed for more severe pain.  Essential hypertension  -Improved home readings and in office, follow-up with cardiology as planned.  No orders of the defined types were placed in this encounter.  Patient Instructions     Please follow-up with cardiology as planned.  Blood pressure looks okay today.   Shoulder exam is reassuring today.  Based on x-rays and evaluation in office, I think that some of the symptoms could still be coming from your neck.  Meloxicam once per day can be tried temporarily to see if that will help, but will also refer you to orthopedic specialist for evaluation and deciding on other imaging versus physical therapy if needed.  Thanks for coming in today, take care.  Return to the clinic or go to the nearest emergency room if any of your symptoms worsen or new symptoms occur.   If you have lab work done today you will be contacted with your lab results within the next 2 weeks.  If you have not heard from [place] then please contact us. The fastest way to get your results is to register for My Chart.   IF you received an x-ray today, you will receive an invoice from [facility]. Please contact [facility] at [phone] with questions or concerns regarding your invoice.   IF you received labwork today, you will receive an invoice from [facility]. Please contact [facility] at  [phone] with questions or concerns regarding your invoice.   Our billing staff will not be able to assist you with questions regarding bills from these companies.  You will be contacted with the  lab results as soon as they are available. The fastest way to get your results is to activate your My Chart account. Instructions are located on the last page of this paperwork. If you have not heard from [place] regarding the results in 2 weeks, please contact this office.       Signed,   Merri Ray, MD [facility].  05/24/19 10:59 AM

## 2019-05-30 ENCOUNTER — Other Ambulatory Visit: Payer: Self-pay | Admitting: Family Medicine

## 2019-05-30 DIAGNOSIS — M25511 Pain in right shoulder: Secondary | ICD-10-CM

## 2019-05-30 DIAGNOSIS — M5412 Radiculopathy, cervical region: Secondary | ICD-10-CM

## 2019-05-30 DIAGNOSIS — M79601 Pain in right arm: Secondary | ICD-10-CM

## 2019-05-31 NOTE — Telephone Encounter (Signed)
Would Dr. Carlota Raspberry like to refill

## 2019-06-07 ENCOUNTER — Other Ambulatory Visit: Payer: Self-pay | Admitting: Family Medicine

## 2019-06-07 DIAGNOSIS — E039 Hypothyroidism, unspecified: Secondary | ICD-10-CM

## 2019-07-03 ENCOUNTER — Other Ambulatory Visit: Payer: Self-pay | Admitting: Family Medicine

## 2019-07-03 DIAGNOSIS — M25511 Pain in right shoulder: Secondary | ICD-10-CM

## 2019-07-03 DIAGNOSIS — M5412 Radiculopathy, cervical region: Secondary | ICD-10-CM

## 2019-07-03 DIAGNOSIS — M79601 Pain in right arm: Secondary | ICD-10-CM

## 2019-07-03 NOTE — Telephone Encounter (Signed)
Requested Prescriptions  Pending Prescriptions Disp Refills  . meloxicam (MOBIC) 7.5 MG tablet [Pharmacy Med Name: MELOXICAM 7.5MG  TABLETS] 30 tablet 0    Sig: TAKE 1 TABLET(7.5 MG) BY MOUTH DAILY     Analgesics:  COX2 Inhibitors Failed - 07/03/2019  3:43 AM      Failed - HGB in normal range and within 360 days    Hemoglobin  Date Value Ref Range Status  07/17/2017 13.3 11.1 - 15.9 g/dL Final         Passed - Cr in normal range and within 360 days    Creat  Date Value Ref Range Status  05/19/2016 0.81 0.50 - 1.10 mg/dL Final   Creatinine, Ser  Date Value Ref Range Status  11/03/2018 0.84 0.57 - 1.00 mg/dL Final         Passed - Patient is not pregnant      Passed - Valid encounter within last 12 months    Recent Outpatient Visits          1 month ago Pain in joint of right shoulder   [facility], Ranell Patrick, MD   2 months ago Hypothyroidism, unspecified type   [facility], Ranell Patrick, MD   2 months ago Pain in joint of right shoulder   [facility], MD   8 months ago Hypothyroidism, unspecified type   [facility], Ranell Patrick, MD   1 year ago Acute pain of left knee   [facility], Ranell Patrick, MD

## 2019-07-21 ENCOUNTER — Other Ambulatory Visit: Payer: Self-pay | Admitting: Family Medicine

## 2019-07-21 DIAGNOSIS — M25511 Pain in right shoulder: Secondary | ICD-10-CM

## 2019-07-21 DIAGNOSIS — M79601 Pain in right arm: Secondary | ICD-10-CM

## 2019-07-21 DIAGNOSIS — M5412 Radiculopathy, cervical region: Secondary | ICD-10-CM

## 2019-07-23 ENCOUNTER — Encounter: Payer: Self-pay | Admitting: Family Medicine

## 2019-07-25 ENCOUNTER — Telehealth: Payer: Self-pay | Admitting: Family Medicine

## 2019-07-25 NOTE — Telephone Encounter (Signed)
Copied from [place] [phone]. Topic: Referral - Request for Referral >> Jul 25, 2019  8:24 AM Richardo Priest, NT wrote: Has patient seen PCP for this complaint? No and patient unsure *If NO, is insurance requiring patient see PCP for this issue before PCP can refer them? Referral for which specialty: dermatologist  Preferred provider/office: provider in [place] Reason for referral: patient saw different provider and they are requesting this referral.  Patient call back is [phone]. >> Jul 25, 2019  8:32 AM Richardo Priest, NT wrote: Patient is wanting a call back in regards to labs that were done at other providers office that were sent to office. Patient had to see this provider due to being out of town. Please advise.

## 2019-07-26 ENCOUNTER — Telehealth: Payer: Self-pay | Admitting: Family Medicine

## 2019-07-26 NOTE — Telephone Encounter (Signed)
Pt calling to check status. Please advise  °

## 2019-07-26 NOTE — Telephone Encounter (Signed)
Pt faxed pprwrk from [facility] in [place]. Pt is asking for Surgical Oncologist referral. Please advise if OV is required

## 2019-07-27 NOTE — Telephone Encounter (Signed)
Pt is asking for surgical oncologist

## 2019-07-28 ENCOUNTER — Telehealth: Payer: Self-pay | Admitting: *Deleted

## 2019-07-28 NOTE — Telephone Encounter (Signed)
Pt called states she was seen by Dr. Jacqualyn Posey a year ago and she has labs and wanted him to refer her to a doctor in [place].

## 2019-07-28 NOTE — Telephone Encounter (Signed)
Left message for pt to call to discuss her request.

## 2019-07-28 NOTE — Telephone Encounter (Signed)
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 Pt states she had been seen out-of-town, because her mtr had died, and is back in town. Pt states she had wanted to see if Dr. Jacqualyn Posey would review the pathology and refer her to someone in [place]. I told pt that if she wanted, she could have doctor's office that performed the pathology procedure could make a direct referral to a [facility] treatment facility of her choice. Pt states understanding.

## 2019-07-28 NOTE — Telephone Encounter (Signed)
Pt called states she received a call from Dr. Jacqualyn Posey. I told pt that he wanted to know which toe and if she would like an appt. Pt states she made an appt with [facility] for Monday and I asked her to let us know the outcome. Pt agreed.

## 2019-07-29 DIAGNOSIS — C439 Malignant melanoma of skin, unspecified: Secondary | ICD-10-CM | POA: Insufficient documentation

## 2019-07-29 NOTE — Telephone Encounter (Signed)
  Left hallux nail lesion, eval by derm 06/30/19 - soft tissue growth removed. Path report indicated malignant melanoma, Acral.   Saw surgical oncologist? In [place] today.  Lymph nodes looked ok, but due to degree of issue, plan for surgery with toe amputation,  PET scan planned on 8/24. Surgery 9/2. Plans to get other opinion and possible procedure closer here.   Called [facility] - has appt with specialist [place] Beasley on Monday for evaluation. No referral needed from me at this time.  Will decide on next step after that visit.   Mom passed away in June 16, 2023. Had COPD, CHF other chronic health issues, fluid overload in May. Declined after some initial improvement from being in hospital.   Denies depression at this time. Will determine other needs after [facility] visit. No acute needs at this time.

## 2019-08-01 NOTE — Telephone Encounter (Signed)
Lm for pt to call

## 2019-08-20 ENCOUNTER — Other Ambulatory Visit: Payer: Self-pay | Admitting: Family Medicine

## 2019-08-20 DIAGNOSIS — M25511 Pain in right shoulder: Secondary | ICD-10-CM

## 2019-08-20 DIAGNOSIS — M79601 Pain in right arm: Secondary | ICD-10-CM

## 2019-08-20 DIAGNOSIS — M5412 Radiculopathy, cervical region: Secondary | ICD-10-CM

## 2019-08-20 NOTE — Telephone Encounter (Signed)
Requested medication (s) are due for refill today:  yes  Requested medication (s) are on the active medication list:  yes  Future visit scheduled:  No  Last Refill: 07/21/19; #30; no refills  Notes to clinic:  Was prescribed "short term" at office visit 05/24/19; please review with provider  Requested Prescriptions  Pending Prescriptions Disp Refills   meloxicam (MOBIC) 7.5 MG tablet [Pharmacy Med Name: MELOXICAM 7.5MG  TABLETS] 30 tablet 0    Sig: TAKE 1 TABLET(7.5 MG) BY MOUTH DAILY     Analgesics:  COX2 Inhibitors Failed - 08/20/2019  7:01 AM      Failed - HGB in normal range and within 360 days    Hemoglobin  Date Value Ref Range Status  07/17/2017 13.3 11.1 - 15.9 g/dL Final         Passed - Cr in normal range and within 360 days    Creat  Date Value Ref Range Status  05/19/2016 0.81 0.50 - 1.10 mg/dL Final   Creatinine, Ser  Date Value Ref Range Status  11/03/2018 0.84 0.57 - 1.00 mg/dL Final         Passed - Patient is not pregnant      Passed - Valid encounter within last 12 months    Recent Outpatient Visits          2 months ago Pain in joint of right shoulder   [facility], Ranell Patrick, MD   3 months ago Hypothyroidism, unspecified type   [facility], Ranell Patrick, MD   3 months ago Pain in joint of right shoulder   [facility], MD   4 months ago Hypothyroidism, unspecified type   [facility], MD   9 months ago Hypothyroidism, unspecified type   [facility], Ranell Patrick, MD

## 2019-09-01 ENCOUNTER — Encounter: Payer: Self-pay | Admitting: Family Medicine

## 2019-09-14 ENCOUNTER — Other Ambulatory Visit: Payer: Self-pay | Admitting: Family Medicine

## 2019-09-14 DIAGNOSIS — E039 Hypothyroidism, unspecified: Secondary | ICD-10-CM

## 2019-10-04 ENCOUNTER — Encounter: Payer: Self-pay | Admitting: Podiatry

## 2019-10-05 ENCOUNTER — Telehealth: Payer: Self-pay | Admitting: Podiatry

## 2019-10-05 NOTE — Telephone Encounter (Signed)
Dr. Regina Eck would love for you to give her a call back when you  can she did miss your call yesterday but looks forward to another call

## 2019-10-05 NOTE — Telephone Encounter (Signed)
I called and spoke to the patient. She wanted to give me an update on what was going on to help increase awareness awareness about ALM. She states that when she saw me she had trimmed the nail but over time the nail progressed and it started to lift up and the darkness spread. She had moved and she saw another doctor in [place] where she was diagnosed with melanoma. She underwent amputation. I thanked her for letting me know what was going on and I will do what I can to help increase awareness about this particular cause of melanoma. Encouraged her to call with any questions or concerns.

## 2019-11-20 IMAGING — CR CERVICAL SPINE - 2-3 VIEW
3 series · 3 of 3 positions shown · non-contrast
Comparison: None.

CLINICAL DATA: Pain

EXAM:
CERVICAL SPINE - 2-3 VIEW

[w c-spine lat]
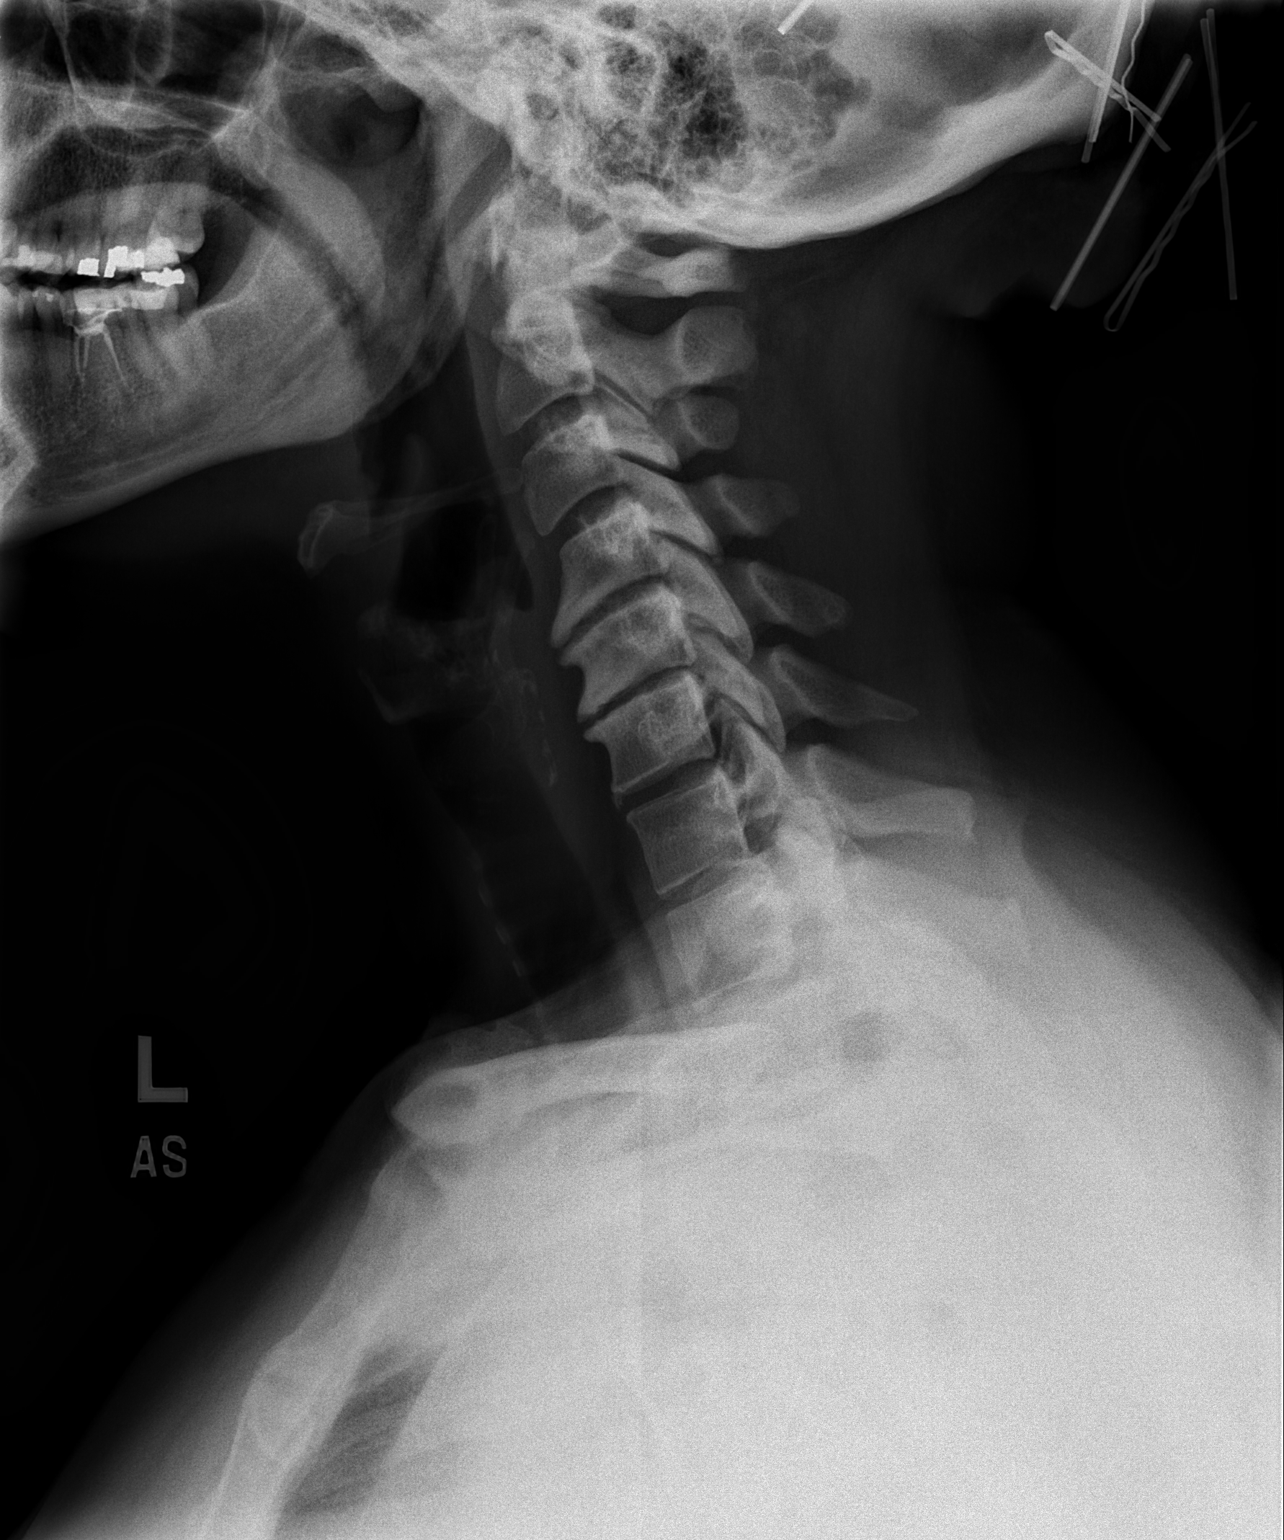

[w c-spine a.p. *]
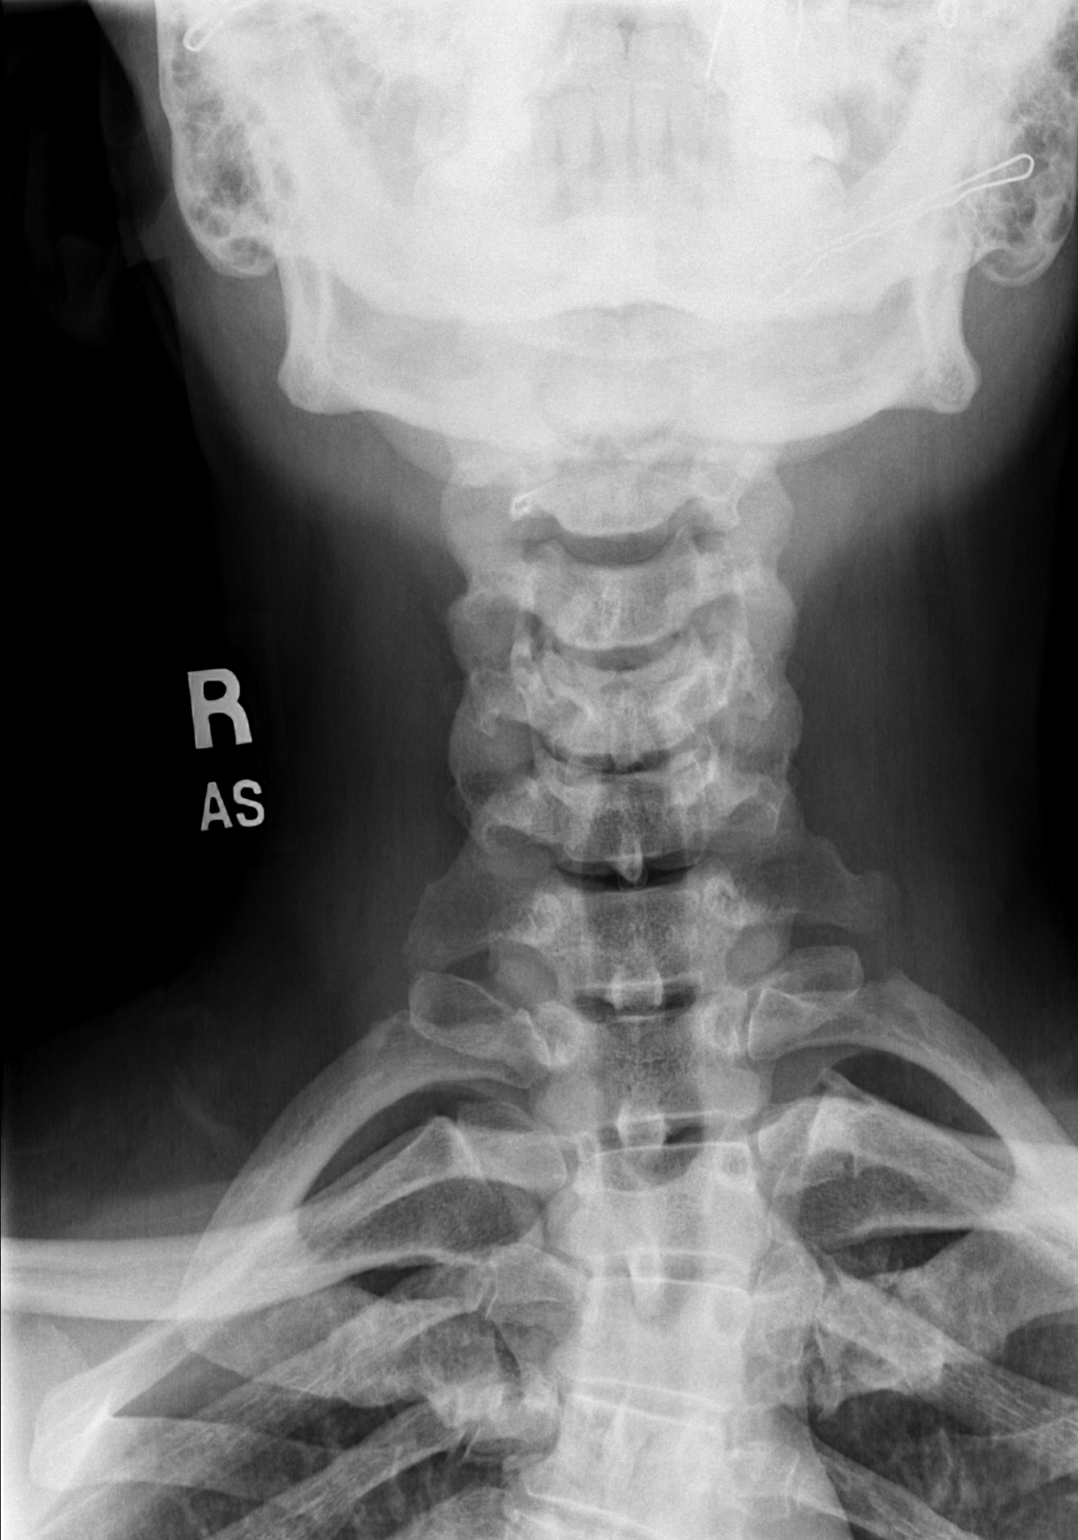

[w c-spine odontoid *]
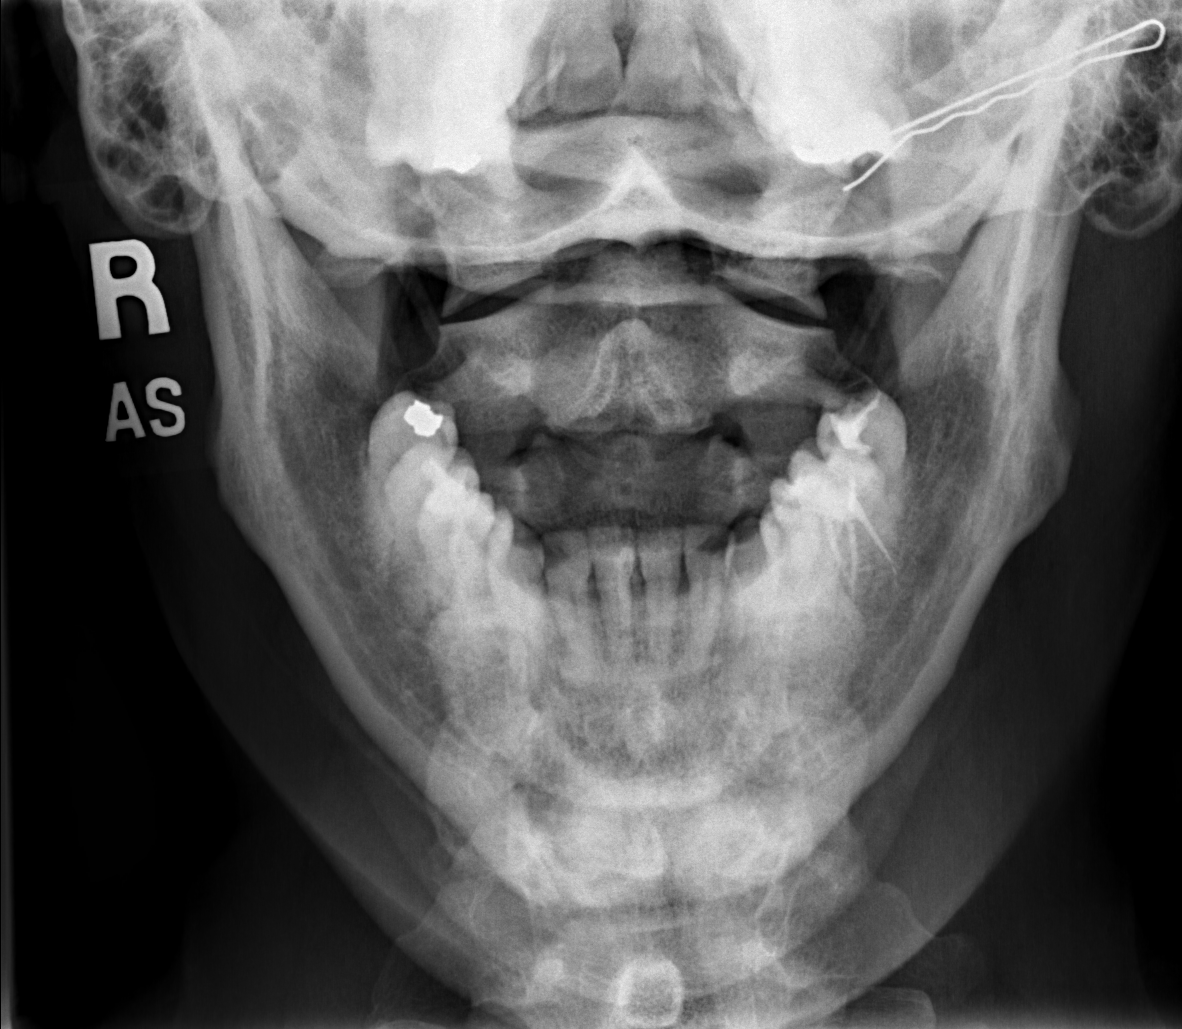

[3 of 3 positions shown; findings below may reference images not displayed]

FINDINGS: There is straightening of the normal cervical lordotic curvature.
There is disc height loss at the C4-C5 and C5-C6 levels. There is no
prevertebral soft tissue swelling. There is no displaced fracture.
No dislocation.
IMPRESSION: 1. No acute displaced fracture.
2. Degenerative changes as above.

## 2019-11-20 IMAGING — CR RIGHT SHOULDER - 2+ VIEW
3 series · 3 of 3 positions shown · non-contrast
Comparison: None.

CLINICAL DATA: Chronic right shoulder pain.

EXAM:
RIGHT SHOULDER - 2+ VIEW

[w shoulder ap internal righ *]
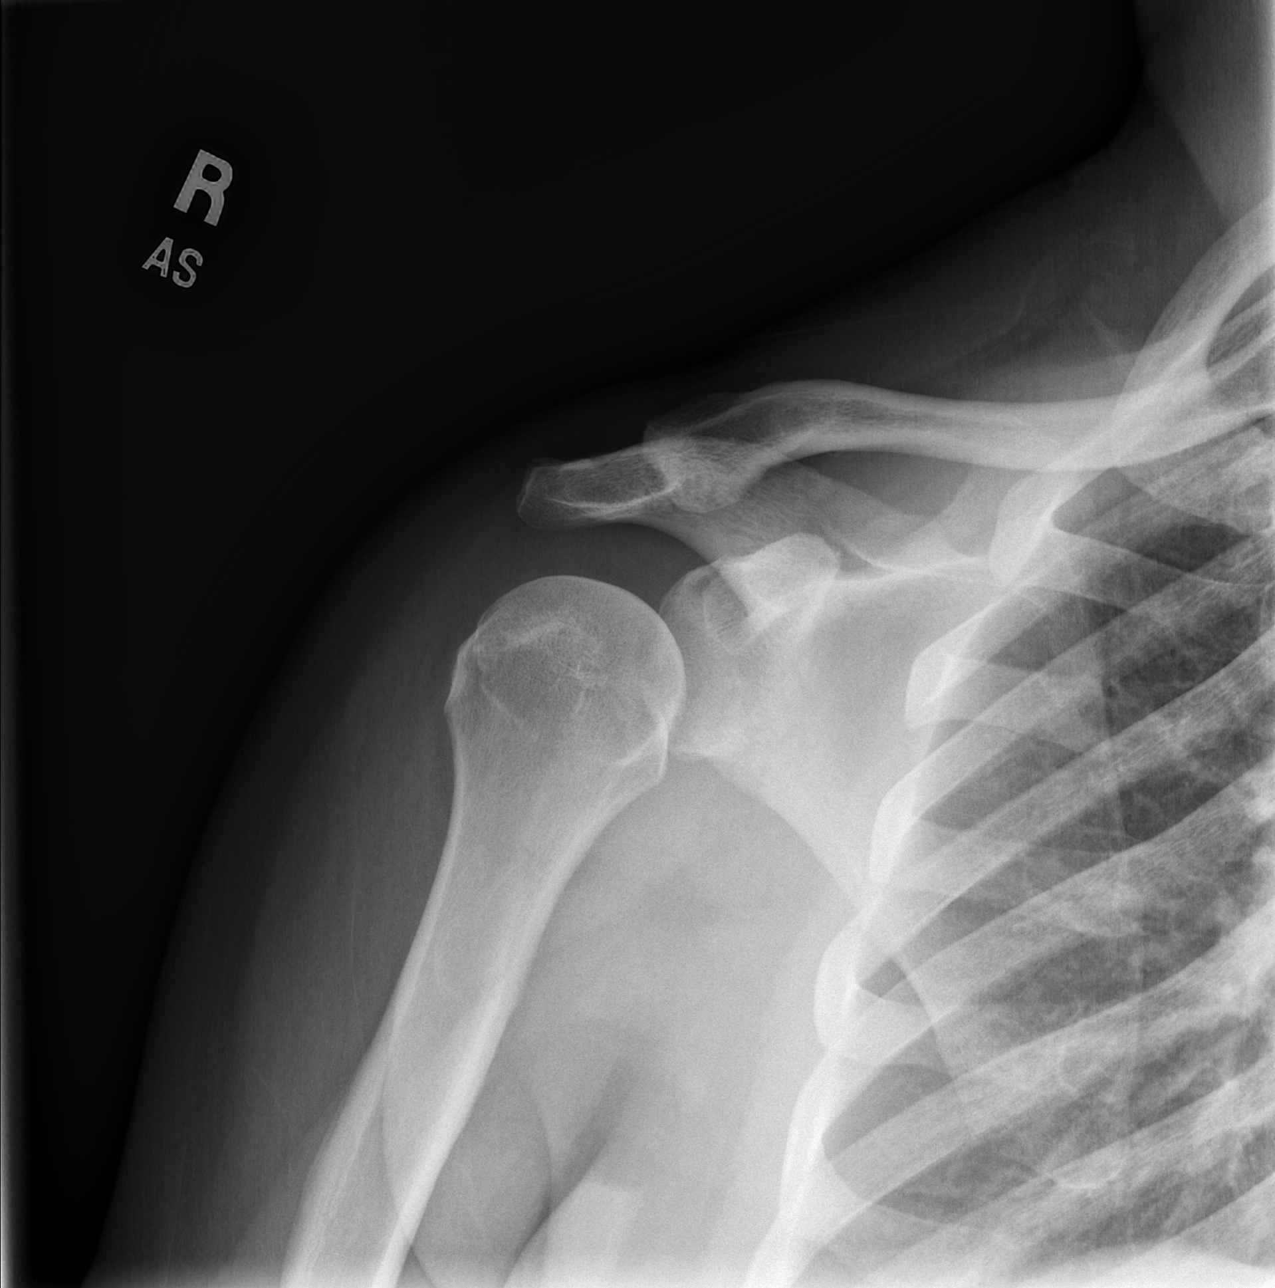

[w shoulder y view right *]
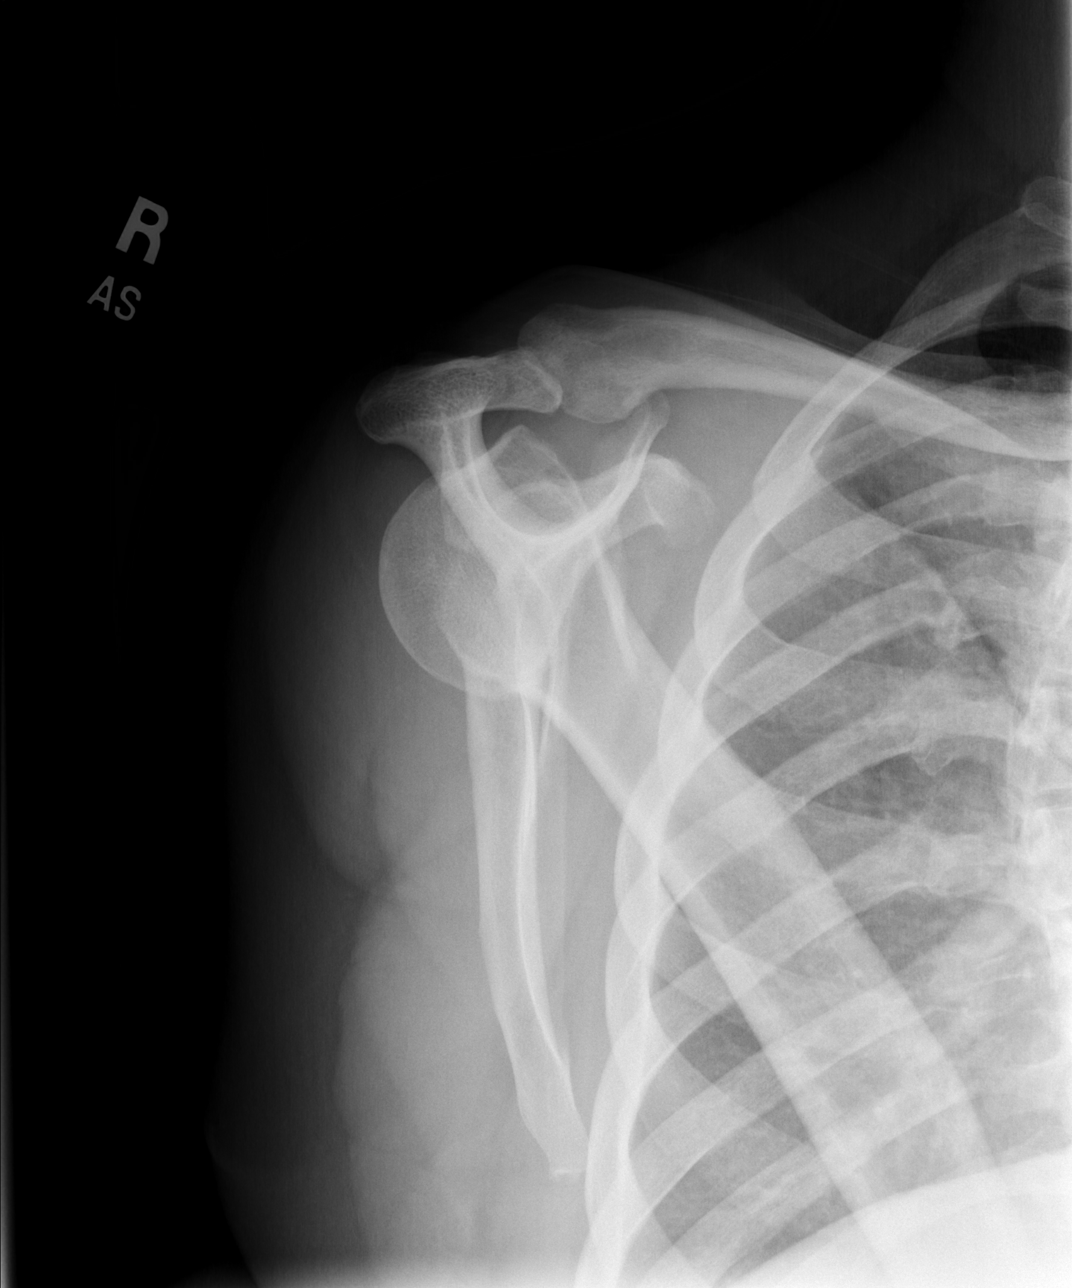

[w shoulder axillary right *]
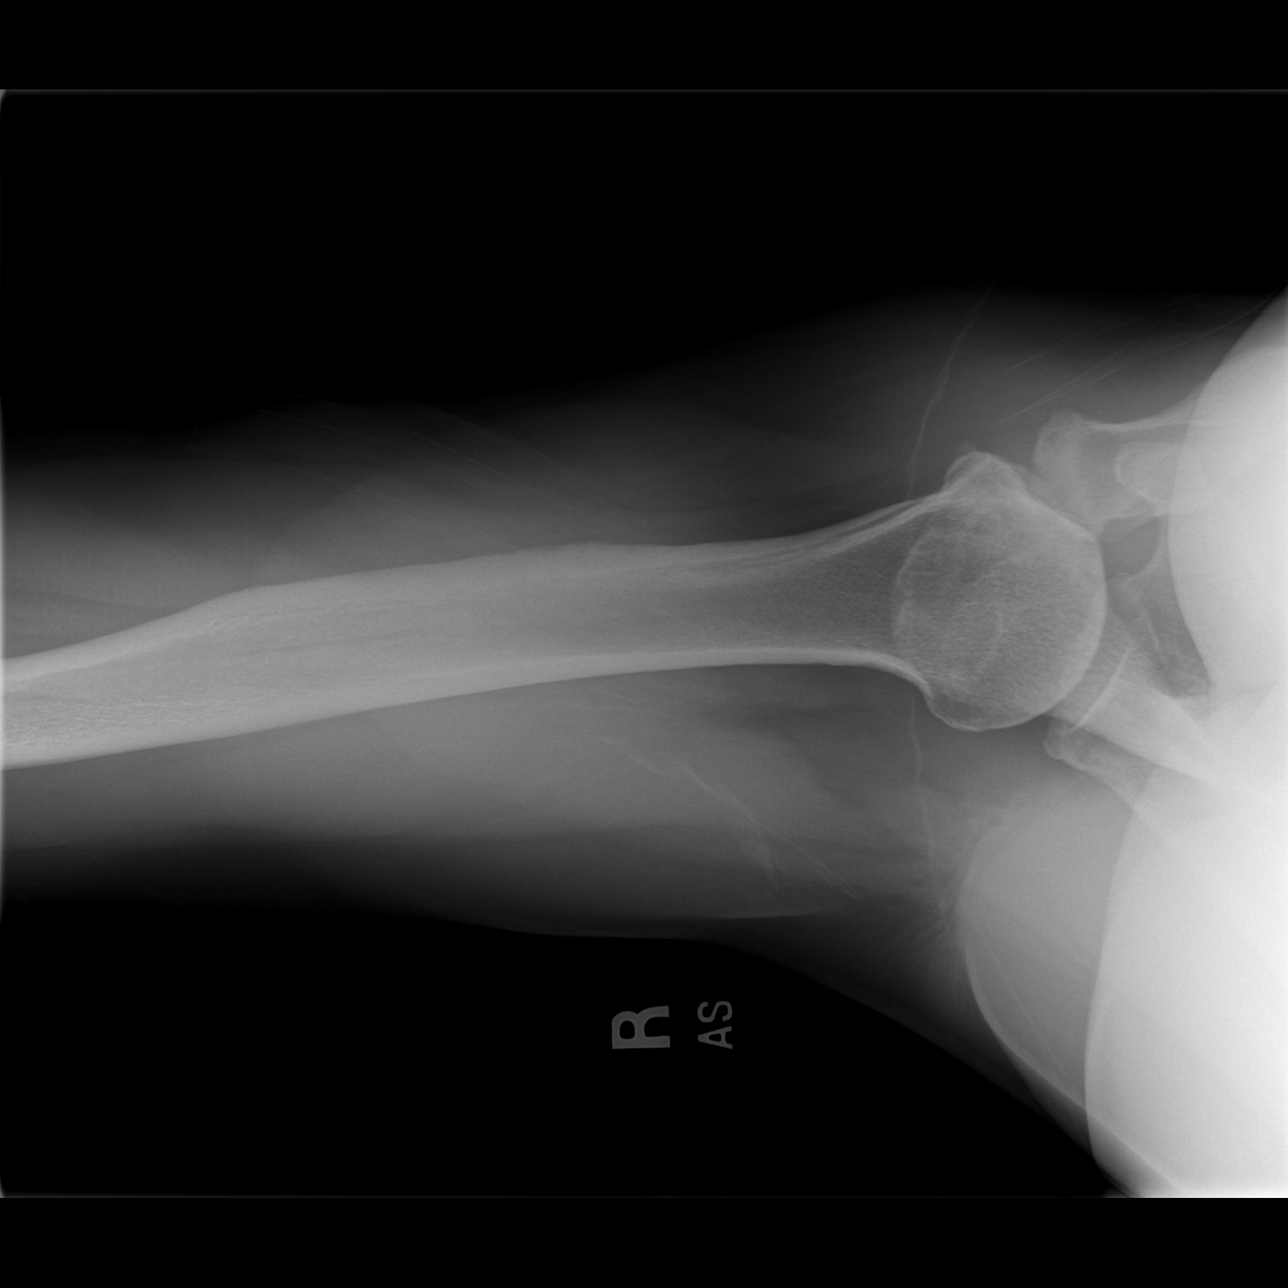

[3 of 3 positions shown; findings below may reference images not displayed]

FINDINGS: There is no evidence of fracture or dislocation. There is no
evidence of arthropathy or other focal bone abnormality. Soft
tissues are unremarkable.
IMPRESSION: Negative.

## 2019-11-22 ENCOUNTER — Encounter: Payer: Self-pay | Admitting: Family Medicine

## 2019-11-24 MED ORDER — ALPRAZOLAM 0.5 MG PO TABS: 0.2500 mg | ORAL_TABLET | Freq: Once | ORAL | 0 refills | Status: AC

## 2019-11-24 NOTE — Telephone Encounter (Signed)
Situational anxiety for testing/CT scan.  Alprazolam prescribed to take prior to procedure only, controlled substance database reviewed without concerns

## 2019-12-12 LAB — HM COLONOSCOPY

## 2019-12-29 ENCOUNTER — Ambulatory Visit: Payer: BC Managed Care – PPO | Admitting: Physical Therapy

## 2019-12-30 ENCOUNTER — Ambulatory Visit: Payer: BC Managed Care – PPO | Attending: Internal Medicine

## 2019-12-30 DIAGNOSIS — Z20822 Contact with and (suspected) exposure to covid-19: Secondary | ICD-10-CM

## 2019-12-31 LAB — NOVEL CORONAVIRUS, NAA: SARS-CoV-2, NAA: NOT DETECTED

## 2020-01-05 ENCOUNTER — Encounter: Payer: Self-pay | Admitting: Family Medicine

## 2020-01-05 NOTE — Progress Notes (Signed)
Repeat colonoscopy in 5 years

## 2020-01-09 ENCOUNTER — Ambulatory Visit: Payer: BC Managed Care – PPO | Attending: General Surgery

## 2020-01-09 ENCOUNTER — Other Ambulatory Visit: Payer: Self-pay

## 2020-01-09 DIAGNOSIS — I890 Lymphedema, not elsewhere classified: Secondary | ICD-10-CM | POA: Insufficient documentation

## 2020-01-09 NOTE — Therapy (Signed)
[place], [place], [address] Phone: [phone]   Fax:  [phone]  Physical Therapy Evaluation  Patient Details  Name: Heidi Soto MRN: [id number] Date of Birth: 1971-06-23 Referring Provider (PT): Melodye Ped PA   Encounter Date: 01/09/2020  PT End of Session - 01/09/20 1710    Visit Number  1    Number of Visits  9    Date for PT Re-Evaluation  02/13/20    PT Start Time  1608    PT Stop Time  1700    PT Time Calculation (min)  52 min    Activity Tolerance  Patient tolerated treatment well    Behavior During Therapy  [facility] for tasks assessed/performed       Past Medical History:  Diagnosis Date  . Graves disease   . Headache(784.0)   . Hypertension   . Thyroid disease     Past Surgical History:  Procedure Laterality Date  . ABDOMINAL HYSTERECTOMY N/A 06/29/2013   Procedure: HYSTERECTOMY ABDOMINAL;  Surgeon: Elveria Royals, MD;  Location: [facility];  Service: Gynecology;  Laterality: N/A;  . BILATERAL SALPINGECTOMY Bilateral 06/29/2013   Procedure: BILATERAL SALPINGECTOMY;  Surgeon: Elveria Royals, MD;  Location: [facility];  Service: Gynecology;  Laterality: Bilateral;  . NO PAST SURGERIES      There were no vitals filed for this visit.   Subjective Assessment - 01/09/20 1611    Subjective  Pt reports that she had 2 lymph node removal on 08/17/2019 and then had a L great toe amputation on 08/18/2019. Pt reports that right after she had surgery she was elevating her leg for about two months and wearing  a knee high compressoin sock. She states that when she went back to work in December she really started to notice a lot of increased edema in her L ankle and foot but also feels it in the medial aspect of her L thigh. Pt reports that she has some tightness near where her incision is on her medial thigh. Pt reports that she has just started to get back into her step aerobics class again and feels  that both of her legs feel heavier but especially her L leg.    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    Patient Stated Goals  Decrease the swelling and learn go manage lymphedema.    Currently in Pain?  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   No    Is the patient reluctant to leave their home because of a fear of falling?   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      Surgeries   Other Surgery Date  08/18/19    Number Lymph Nodes Removed  2      Treatment   Active Chemotherapy Treatment  No    Past Chemotherapy Treatment  No    Active Radiation Treatment  No    Past Radiation Treatment  No    Current Hormone Treatment  No    Past Hormone Therapy  No      What other symptoms do you have   Are you Having Heaviness or  Tightness  Yes    Are you having Pain  No    Are you having pitting edema  No    Is it Hard or Difficult finding clothes that fit  No    Do you have infections  No    Is there Decreased scar mobility  Yes    Stemmer Sign  No      Lymphedema Stage   Stage  STAGE 1 SPONTANEOUSLY REVERSIBLE      Lymphedema Assessments   Lymphedema Assessments  Lower extremities      Right Lower Extremity Lymphedema   20 cm Proximal to Suprapatella  68.8 cm    10 cm Proximal to Suprapatella  58 cm    At Midpatella/Popliteal Crease  36.6 cm    30 cm Proximal to Floor at Lateral Plantar Foot  38.5 cm    20 cm Proximal to Floor at Lateral Plantar Foot  29.3 1    10  cm Proximal to Floor at Lateral Malleoli  22 cm    5 cm Proximal to 1st MTP Joint  23.9 cm    Across MTP Joint  23.6 cm      Left Lower Extremity Lymphedema   20 cm Proximal to Suprapatella  68.7 cm    10 cm Proximal to Suprapatella  59 cm    At Midpatella/Popliteal Crease  38 cm    30 cm Proximal to Floor at Lateral Plantar Foot  39.1 cm    20 cm Proximal to Floor at Lateral Plantar Foot  29.2 cm    10 cm Proximal to Floor at Lateral Malleoli  22.5 cm    5 cm Proximal to 1st MTP Joint  23.7 cm    Across MTP Joint  24.8 cm             [facility]patient Re[facility]09/[facility]  Lymphedema Life Impact Scale Total Score  33.82 %      Objective measurements completed on examination: See above findings.      [facility] Adult PT Treatment/Exercise - 01/09/20 0001      Exercises   Exercises  Other Exercises    Other Exercises  lymphatic facilitation exercises: Cervical flexion/extension, R/L side bending, R/L rotation, shoulder flexion, shoulder shrug, scapular retraction, trunk side bending R/L, knee fleixon/extension, toe raises, heel raises all exercises performed in standing for 5 repetitions with demonstration and return demonstration. VC to avoid pain.              PT Education -  01/09/20 1709    Education Details  Access Code: [email or web address], Pt was educated on the anatomy/physiology of the lymphatic system. Discussed wearing thigh high compression and the importance of complete decongestive therapy including skin care, exercise, compression and manual lymph drainage. She was educated on post-operative edema vs. lymphedema and discussed the stages of lymphedema. Pt was educated on the importance of exercise in order to decrease lymphatic fluid.    Person(s) Educated  Patient    Methods  Explanation;Demonstration    Comprehension  Verbalized understanding;Returned demonstration       PT Short Term Goals - 01/09/20 1716      PT SHORT TERM GOAL #1   Title  Pt will be independent with HEP and self MLD for autonomy of care.    Baseline  pt does not know how to perform MLD    Time  2    Period  Weeks    Status  New    Target Date  01/30/20        PT Long Term Goals - 01/09/20 1716      PT LONG TERM GOAL #1   Title  Patient will have reduction of L distal thigh limb girth by 2 cm within 4 weeks to demonstrate decreased fluid.    Baseline  see measurements .    Time  4    Period  Weeks    Status  New    Target Date  02/13/20      PT LONG TERM GOAL #2   Title  Patient to be properly fitted with compression garment to wear on daily basis.    Baseline  Pt does not have a compression garment.    Time  4    Period  Weeks    Status  New    Target Date  02/13/20      PT LONG TERM GOAL #3   Title  Patient will be independent in prevention/self-care management principles including self-massage/bandaging and long term management plan for edema.    Baseline  pt is unaware of how to manage edema at this time.    Time  4    Period  Weeks    Status  New    Target Date  02/13/20             Plan - 01/09/20 1711    Clinical Impression Statement  Pt presents to physical therapy with reports of heaviness/aching in her LLE following L great toe amputation and 2 lymph  node removal in the L thigh following diagnosis of L great toe malignant carcinoma. She reports that her symptoms have gotten worse since going back to work and that she was elevating her leg for about 2 months. Pt was educated that her body needs to acclimate to being in a dependent position and that compression will help her body with this as well as educated her on lymphedema vs. post-op swelling. Pt demonstrates slight increase in her distal thigh circumferential measurements compared to her R distal thigh and has an appearance of shining sking ; demonstrating increased pressure from increased fluid. Pt will benefit from skilled physical therapy services  2x/week for 4 weeks in order to address the above limitations to learn home management of edema in orderto decrease risk for immobility and infection related to swelling.    Personal Factors and Comorbidities  Comorbidity 1    Comorbidities  2 lymph node removal    Stability/Clinical Decision Making  Stable/Uncomplicated    Clinical Decision Making  Low    Rehab Potential  Excellent    PT Frequency  2x / week    PT Duration  4 weeks    PT Treatment/Interventions  Therapeutic activities;Therapeutic exercise;Patient/family education;Manual techniques    PT Next Visit Plan  Begin teaching self MLD and performign MLD for the LLE, possibly begin self bandaging for night?    PT Home Exercise Plan  Access Code: [email or web address]    Recommended Other Services  thigh high compression garment.    Consulted and Agree with Plan of Care  Patient       Patient will benefit from skilled therapeutic intervention in order to improve the following deficits and impairments:  Increased edema  Visit Diagnosis: Lymphedema, not elsewhere classified     Problem List Patient Active Problem List   Diagnosis Date Noted  . Obesity, unspecified 03/13/2014  . S/P abdominal hysterectomy 06/30/2013  . H/O Graves' disease 12/30/2012  . Fibroids 12/30/2012    Ander Purpura, PT 01/09/2020, 5:19 PM  [place], [place], [address] Phone: [phone]   Fax:  [phone]  Name: JOYANNA REY MRN: [id number] Date of Birth: 1971/08/12

## 2020-01-09 NOTE — Patient Instructions (Signed)
Access Code: [email or web address]  URL: [email or web address]  Date: 01/09/2020  Prepared by: Tomma Rakers   Exercises Standing Upper Cervical Flexion and Extension - 20 reps - 1 sets - 1x daily - 7x weekly Standing Cervical Sidebending AROM - 20 reps - 1 sets - 1x daily - 7x weekly Standing Cervical Rotation AROM - 20 reps - 1 sets - 1x daily - 7x weekly Standing Shoulder Flexion Full Range - 20 reps - 1 sets - 1x daily - 7x weekly Standing Shoulder Shrugs - 20 reps - 1 sets - 1x daily - 7x weekly Standing Scapular Retraction - 20 reps - 1 sets - 1x daily - 7x weekly Trunk Sidebending with Compression Garment - 20 reps - 1 sets - 1x daily - 7x weekly Standing Hip Extension with Counter Support - 20 reps - 1 sets - 1x daily - 7x weekly Seated Knee Flexion Extension AROM - 20 reps - 1 sets - 1x daily - 7x weekly Heel Toe Raises with Counter Support - 20 reps - 1 sets - 1x daily - 7x weekly

## 2020-01-12 ENCOUNTER — Other Ambulatory Visit: Payer: Self-pay

## 2020-01-12 ENCOUNTER — Ambulatory Visit: Payer: BC Managed Care – PPO | Admitting: Physical Therapy

## 2020-01-12 DIAGNOSIS — I890 Lymphedema, not elsewhere classified: Secondary | ICD-10-CM

## 2020-01-12 NOTE — Therapy (Signed)
[place], [place], [address] Phone: [phone]   Fax:  [phone]  Physical Therapy Treatment  Patient Details  Name: Heidi Soto MRN: [id number] Date of Birth: 06/13/1971 Referring Provider (PT): Melodye Ped PA   Encounter Date: 01/12/2020  PT End of Session - 01/12/20 1653    Visit Number  2    Number of Visits  9    Date for PT Re-Evaluation  02/13/20    PT Start Time  [id number]    PT Stop Time  1650    PT Time Calculation (min)  45 min    Activity Tolerance  Patient tolerated treatment well    Behavior During Therapy  [facility] for tasks assessed/performed       Past Medical History:  Diagnosis Date  . Graves disease   . Headache(784.0)   . Hypertension   . Thyroid disease     Past Surgical History:  Procedure Laterality Date  . ABDOMINAL HYSTERECTOMY N/A 06/29/2013   Procedure: HYSTERECTOMY ABDOMINAL;  Surgeon: Elveria Royals, MD;  Location: [facility];  Service: Gynecology;  Laterality: N/A;  . BILATERAL SALPINGECTOMY Bilateral 06/29/2013   Procedure: BILATERAL SALPINGECTOMY;  Surgeon: Elveria Royals, MD;  Location: [facility];  Service: Gynecology;  Laterality: Bilateral;  . NO PAST SURGERIES      There were no vitals filed for this visit.               [facility]patient [facility] from [facility]09/2020 [facility]  Lymphedema Life Impact Scale Total Score  33.82 %           [facility] Adult PT Treatment/Exercise - 01/12/20 0001      Manual Therapy   Manual Therapy  Edema management;Manual Lymphatic Drainage (MLD)    Manual therapy comments  reinforced elevation and exercise    Edema Management  pt given link for klosetraining for self MLD, compression guru and idea for bioflect leggings. Pt to be fitted for compression stocking tomorrow     Manual Lymphatic Drainage (MLD)  instructed and performed. in supine with legs up on bolster: short neck, superficial  and deep abdominals. right inguinial nodes, anterior interaxillary anastamoisis, left axillary nodes, left inguino-axillary anastamosis, left thigh, knee lower leg, ankle and foot with return along pathways.                PT Short Term Goals - 01/09/20 1716      PT SHORT TERM GOAL #1   Title  Pt will be independent with HEP and self MLD for autonomy of care.    Baseline  pt does not know how to perform MLD    Time  2    Period  Weeks    Status  New    Target Date  01/30/20        PT Long Term Goals - 01/09/20 1716      PT LONG TERM GOAL #1   Title  Patient will have reduction of L distal thigh limb girth by 2 cm within 4 weeks to demonstrate decreased fluid.    Baseline  see measurements .    Time  4    Period  Weeks    Status  New    Target Date  02/13/20      PT LONG TERM GOAL #2   Title  Patient to be properly fitted with compression garment to wear on daily basis.    Baseline  Pt does not  have a compression garment.    Time  4    Period  Weeks    Status  New    Target Date  02/13/20      PT LONG TERM GOAL #3   Title  Patient will be independent in prevention/self-care management principles including self-massage/bandaging and long term management plan for edema.    Baseline  pt is unaware of how to manage edema at this time.    Time  4    Period  Weeks    Status  New    Target Date  02/13/20            Plan - 01/12/20 1654    Clinical Impression Statement  first session for instruction in MLD.  Pt attentive and tolerated well.  Pt with some some fullnes at medial knee thigh.  Thighs are also very muscular, but she was able to get good skin stretch    Personal Factors and Comorbidities  Comorbidity 1    Comorbidities  2 lymph node removal    Stability/Clinical Decision Making  Stable/Uncomplicated    Rehab Potential  Excellent    PT Frequency  2x / week    PT Next Visit Plan  cointinue teaching self MLD and performign MLD for the LLE,  Resmeasure  and consider possibly begin self bandaging for night?       Patient will benefit from skilled therapeutic intervention in order to improve the following deficits and impairments:  Increased edema  Visit Diagnosis: Lymphedema, not elsewhere classified     Problem List Patient Active Problem List   Diagnosis Date Noted  . Obesity, unspecified 03/13/2014  . S/P abdominal hysterectomy 06/30/2013  . H/O Graves' disease 12/30/2012  . Fibroids 12/30/2012   Donato Heinz. Owens Shark PT  Norwood Levo 01/12/2020, 4:56 PM  [place], [place], [address] Phone: [phone]   Fax:  [phone]  Name: Heidi Soto MRN: [id number] Date of Birth: 11/01/1971

## 2020-01-12 NOTE — Patient Instructions (Signed)
[email or web address] Resources Self care videos   [email or web address]    bioflect garments

## 2020-01-16 ENCOUNTER — Other Ambulatory Visit: Payer: Self-pay

## 2020-01-16 ENCOUNTER — Ambulatory Visit: Payer: BC Managed Care – PPO | Attending: General Surgery

## 2020-01-16 DIAGNOSIS — I890 Lymphedema, not elsewhere classified: Secondary | ICD-10-CM | POA: Insufficient documentation

## 2020-01-16 NOTE — Patient Instructions (Signed)
Start with circles near the neck, above the collarbones. Make __5__ in place stationary circles on each side.  Deep Effective Breath   Standing, sitting, or laying down place both hands on the belly. Take a deep breath IN, expanding the belly; then breath OUT, contracting the belly. Repeat __5__ times. Do __2-3__ sessions per day and before each self massage.  Inguinal Nodes to Axilla - Clear   On involved side, at armpit, make _5__ in-place circles. Then from hip proceed in sections to armpit with stationary circles or pumps _5_ times, this is one pathway.  Then make __5__ in place circles over Rt groin (at panty line) , then from Lt hip to Rt hip make stationary circles __5__ times.  LEG: Knee to Hip - Clear   Pump up outer thigh of involved leg from knee to outer hip __5__ times. Then do stationary circles from inner to outer thigh, covering all of inner thigh, then do outer thigh again. Next, interlace fingers behind knee IF ABLE and make in-place circles. Do _5_ times of each sequence. Can also do stationary circles on each side of knee. Do _1__ time per day.  LEG: Ankle to Hip Sweep   Hands on sides of ankle of involved leg, pump _5__ times up both sides of lower leg, then retrace steps up outer thigh to hip as before and back to pathways. Do _2-3_ times. Do __1_ time per day.  FOOT: Dorsum of Foot and Toes Massage   One hand on top of foot make _5_ stationary circles or pumps, then either on top of toes or each individual toe do _5_ pumps. Stationary circles on each side of ankle __5__ times.Then retrace all steps pumping back up both sides of lower leg, outer thigh, and then pathways. Finish with what you started with, _5_ circles at involved side arm pit and Rt groin. All _2-3_ times at each sequence. Do _1__ time per day.  Cancer Rehab [phone]

## 2020-01-16 NOTE — Therapy (Signed)
[place], [place], [address] Phone: [phone]   Fax:  [phone]  Physical Therapy Treatment  Patient Details  Name: Heidi Soto MRN: [id number] Date of Birth: 19-Oct-1971 Referring Provider (PT): Melodye Ped PA   Encounter Date: 01/16/2020  PT End of Session - 01/16/20 1721    Visit Number  3    Number of Visits  9    Date for PT Re-Evaluation  02/13/20    PT Start Time  [id number]    PT Stop Time  [id number]    PT Time Calculation (min)  66 min    Activity Tolerance  Patient tolerated treatment well    Behavior During Therapy  [facility] for tasks assessed/performed       Past Medical History:  Diagnosis Date  . Graves disease   . Headache(784.0)   . Hypertension   . Thyroid disease     Past Surgical History:  Procedure Laterality Date  . ABDOMINAL HYSTERECTOMY N/A 06/29/2013   Procedure: HYSTERECTOMY ABDOMINAL;  Surgeon: Elveria Royals, MD;  Location: [facility];  Service: Gynecology;  Laterality: N/A;  . BILATERAL SALPINGECTOMY Bilateral 06/29/2013   Procedure: BILATERAL SALPINGECTOMY;  Surgeon: Elveria Royals, MD;  Location: [facility];  Service: Gynecology;  Laterality: Bilateral;  . NO PAST SURGERIES      There were no vitals filed for this visit.  Subjective Assessment - 01/16/20 1611    Subjective  My swelling did okay over the weekend. Melissa called me today but I haven't got to call her back yet.    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    Patient Stated Goals  Decrease the swelling and learn go manage lymphedema.    Currently in Pain?  No/denies                  Outpatient Re[facility] from [facility]/09/2020 [facility]  Lymphedema Life Impact Scale Total Score  33.82 %           [facility] Adult PT Treatment/Exercise - 01/16/20 0001      Manual Therapy   Manual Lymphatic Drainage (MLD)  instructed and performed.  in supine with legs up on bolster: short neck, superficial and deep abdominals. right inguinial nodes, anterior interaxillary anastamoisis, left axillary nodes, left inguino-axillary anastamosis, left thigh, knee lower leg, ankle and foot with return along pathways.              PT Education - 01/16/20 1715    Education Details  Lt LE manual lymph drainage    Person(s) Educated  Patient    Methods  Explanation;Demonstration;Handout;Tactile cues    Comprehension  Verbalized understanding;Returned demonstration;Verbal cues required;Tactile cues required;Need further instruction       PT Short Term Goals - 01/09/20 1716      PT SHORT TERM GOAL #1   Title  Pt will be independent with HEP and self MLD for autonomy of care.    Baseline  pt does not know how to perform MLD    Time  2    Period  Weeks    Status  New    Target Date  01/30/20        PT Long Term Goals - 01/09/20 1716      PT LONG TERM GOAL #1   Title  Patient will have reduction of L distal thigh limb girth by 2 cm within 4 weeks to demonstrate decreased  fluid.    Baseline  see measurements .    Time  4    Period  Weeks    Status  New    Target Date  02/13/20      PT LONG TERM GOAL #2   Title  Patient to be properly fitted with compression garment to wear on daily basis.    Baseline  Pt does not have a compression garment.    Time  4    Period  Weeks    Status  New    Target Date  02/13/20      PT LONG TERM GOAL #3   Title  Patient will be independent in prevention/self-care management principles including self-massage/bandaging and long term management plan for edema.    Baseline  pt is unaware of how to manage edema at this time.    Time  4    Period  Weeks    Status  New    Target Date  02/13/20            Plan - 01/16/20 1722    Clinical Impression Statement  Continued with manual lymph drainage of Lt LE instructing with pt throughout. Had her return demo and used hand over hand technique  for correct pressure and skin stretch instruction. Also issued handout fo rher to begin practicing at home. She is able to begin verbalizing good understanding of correct sequence of MLD. Melissa from [facility] called her today regarding garments but has not had a chance to call her back as of yet. Encouraged pt to try on her current workout leggings to see if she has a containing capri pair that she could wear with her knee high garments until thigh highs arrive. Pt verbalized understanding.    Personal Factors and Comorbidities  Comorbidity 1    Comorbidities  2 lymph node removal    Stability/Clinical Decision Making  Stable/Uncomplicated    Rehab Potential  Excellent    PT Frequency  2x / week    PT Duration  4 weeks    PT Treatment/Interventions  Therapeutic activities;Therapeutic exercise;Patient/family education;Manual techniques    PT Next Visit Plan  cointinue teaching self MLD and performing MLD for the LLE,  Resmeasure and consider possibly begin self bandaging for night?    PT Home Exercise Plan  Access Code: [email or web address]    Consulted and Agree with Plan of Care  Patient       Patient will benefit from skilled therapeutic intervention in order to improve the following deficits and impairments:  Increased edema  Visit Diagnosis: Lymphedema, not elsewhere classified     Problem List Patient Active Problem List   Diagnosis Date Noted  . Obesity, unspecified 03/13/2014  . S/P abdominal hysterectomy 06/30/2013  . H/O Graves' disease 12/30/2012  . Fibroids 12/30/2012    Otelia Soto, PTA 01/16/2020, 5:25 PM  [place], [place], [address] Phone: [phone]   Fax:  [phone]  Name: Heidi Soto MRN: [id number] Date of Birth: [birth year]

## 2020-01-19 ENCOUNTER — Ambulatory Visit: Payer: BC Managed Care – PPO | Admitting: Physical Therapy

## 2020-01-19 ENCOUNTER — Encounter: Payer: Self-pay | Admitting: Physical Therapy

## 2020-01-19 ENCOUNTER — Other Ambulatory Visit: Payer: Self-pay

## 2020-01-19 DIAGNOSIS — I890 Lymphedema, not elsewhere classified: Secondary | ICD-10-CM

## 2020-01-19 NOTE — Therapy (Signed)
[place], [place], [address] Phone: [phone]   Fax:  [phone]  Physical Therapy Treatment  Patient Details  Name: Heidi Soto MRN: [id number] Date of Birth: [birth year] Referring Provider (PT): Melodye Ped PA   Encounter Date: 01/19/2020    Past Medical History:  Diagnosis Date  . Graves disease   . Headache(784.0)   . Hypertension   . Thyroid disease     Past Surgical History:  Procedure Laterality Date  . ABDOMINAL HYSTERECTOMY N/A 06/29/2013   Procedure: HYSTERECTOMY ABDOMINAL;  Surgeon: Elveria Royals, MD;  Location: [facility];  Service: Gynecology;  Laterality: N/A;  . BILATERAL SALPINGECTOMY Bilateral 06/29/2013   Procedure: BILATERAL SALPINGECTOMY;  Surgeon: Elveria Royals, MD;  Location: [facility];  Service: Gynecology;  Laterality: Bilateral;  . NO PAST SURGERIES      There were no vitals filed for this visit.  Subjective Assessment - 01/19/20 1842    Subjective  Pt comes in wearing her thigh high compession stocking.    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    Patient Stated Goals  Decrease the swelling and learn go manage lymphedema.    Currently in Pain?  No/denies            LYMPHEDEMA/ONCOLOGY QUESTIONNAIRE - 01/19/20 1617      Left Lower Extremity Lymphedema   20 cm Proximal to Suprapatella  71.5 cm    10 cm Proximal to Suprapatella  59 cm    At Midpatella/Popliteal Crease  38 cm    30 cm Proximal to Floor at Lateral Plantar Foot  38.6 cm    20 cm Proximal to Floor at Lateral Plantar Foot  27.3 cm    10 cm Proximal to Floor at Lateral Malleoli  21.8 cm    5 cm Proximal to 1st MTP Joint  23.5 cm    Across MTP Joint  24 cm           Outpatient [facility]/09/[facility]  Lymphedema Life Impact Scale Total Score  33.82 %           [facility] Adult PT Treatment/Exercise -  01/19/20 0001      Manual Therapy   Manual Therapy  Edema management;Manual Lymphatic Drainage (MLD)    Manual therapy comments  Pt states she has been doing MLD at home and thiinks she is doing OK     Edema Management  stocking comes up to mid thigh, but does not a appear to go all the way up incision. pt has some fullness above the stocking up to groin  suggested that pt get some shapewear biker shorts to apply compression to upper thigh and will send a note to Melissa to see if there are options     Manual Lymphatic Drainage (MLD)  instructed and performed. in supine with legs up on bolster: short neck, superficial and deep abdominals. right inguinial nodes, anterior interaxillary anastamoisis, left axillary nodes, left inguino-axillary anastamosis, left thigh, knee lower leg, ankle and foot with return along pathways.              PT Education - 01/19/20 1846    Education Details  consider compression biker shorts to cover thigh to top of stocking    Person(s) Educated  Patient    Methods  Explanation    Comprehension  Verbalized understanding       PT Short Term  Goals - 01/19/20 1848      PT SHORT TERM GOAL #1   Title  Pt will be independent with HEP and self MLD for autonomy of care.    Baseline  pt does not know how to perform MLD    Time  2    Period  Weeks    Status  On-going        PT Long Term Goals - 01/19/20 1849      PT LONG TERM GOAL #1   Title  Patient will have reduction of L distal thigh limb girth by 2 cm within 4 weeks to demonstrate decreased fluid.    Baseline  see measurements .    Time  4    Period  Weeks    Status  On-going      PT LONG TERM GOAL #2   Title  Patient to be properly fitted with compression garment to wear on daily basis.    Baseline  Pt does not have a compression garment. 01/19/2020. pt had thigh high , but may need more proximal compression    Time  4    Period  Weeks    Status  On-going      PT LONG TERM GOAL #3   Title   Patient will be independent in prevention/self-care management principles including self-massage/bandaging and long term management plan for edema.    Baseline  pt is unaware of how to manage edema at this time.    Time  4    Period  Weeks    Status  On-going            Plan - 01/19/20 1846    Clinical Impression Statement  Remeasured leg. Pt had good reduction of lower leg, but is at risk of devloping fullness at thigh above stocking near area of incision.  She is doing well with MLD    Personal Factors and Comorbidities  Comorbidity 1    Comorbidities  2 lymph node removal    Stability/Clinical Decision Making  Stable/Uncomplicated    PT Frequency  2x / week    PT Duration  4 weeks    PT Treatment/Interventions  Therapeutic activities;Therapeutic exercise;Patient/family education;Manual techniques    PT Next Visit Plan  Monitor thigh above stocking . cointinue teaching self MLD and performing MLD for the LLE,  Resmeasure and consider possibly begin self bandaging for night?    Consulted and Agree with Plan of Care  Patient       Patient will benefit from skilled therapeutic intervention in order to improve the following deficits and impairments:  Increased edema  Visit Diagnosis: Lymphedema, not elsewhere classified     Problem List Patient Active Problem List   Diagnosis Date Noted  . Obesity, unspecified 03/13/2014  . S/P abdominal hysterectomy 06/30/2013  . H/O Graves' disease 12/30/2012  . Fibroids 12/30/2012   Donato Heinz. Owens Shark PT  Norwood Levo 01/19/2020, 6:50 PM  [place], [place], [address] Phone: [phone]   Fax:  [phone]  Name: MABLE OROS MRN: [id number] Date of Birth: 1971-11-18

## 2020-01-23 ENCOUNTER — Ambulatory Visit: Payer: BC Managed Care – PPO

## 2020-01-23 ENCOUNTER — Other Ambulatory Visit: Payer: Self-pay

## 2020-01-23 DIAGNOSIS — I890 Lymphedema, not elsewhere classified: Secondary | ICD-10-CM | POA: Diagnosis not present

## 2020-01-23 NOTE — Therapy (Signed)
[place], [place], [address] Phone: [phone]   Fax:  [phone]  Physical Therapy Treatment  Patient Details  Name: Heidi Soto MRN: [id number] Date of Birth: [birth year] Referring Provider (PT): Melodye Ped PA   Encounter Date: 01/23/2020  PT End of Session - 01/23/20 1606    Visit Number  5    Number of Visits  9    Date for PT Re-Evaluation  02/13/20    PT Start Time  [id number]    PT Stop Time  1658    PT Time Calculation (min)  53 min    Activity Tolerance  Patient tolerated treatment well    Behavior During Therapy  [facility] for tasks assessed/performed       Past Medical History:  Diagnosis Date  . Graves disease   . Headache(784.0)   . Hypertension   . Thyroid disease     Past Surgical History:  Procedure Laterality Date  . ABDOMINAL HYSTERECTOMY N/A 06/29/2013   Procedure: HYSTERECTOMY ABDOMINAL;  Surgeon: Elveria Royals, MD;  Location: [facility];  Service: Gynecology;  Laterality: N/A;  . BILATERAL SALPINGECTOMY Bilateral 06/29/2013   Procedure: BILATERAL SALPINGECTOMY;  Surgeon: Elveria Royals, MD;  Location: [facility];  Service: Gynecology;  Laterality: Bilateral;  . NO PAST SURGERIES      There were no vitals filed for this visit.  Subjective Assessment - 01/23/20 1606    Subjective  Pt reports that she put on her compression late today because she was out of town traveling and did not wear her compression while traveling.    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    Patient Stated Goals  Decrease the swelling and learn go manage lymphedema.    Currently in Pain?  No/denies    Multiple Pain Sites  No            LYMPHEDEMA/ONCOLOGY QUESTIONNAIRE - 01/23/20 1615      Left Lower Extremity Lymphedema   20 cm Proximal to Suprapatella  65.5 cm    10 cm Proximal to Suprapatella  52.8 cm    At Midpatella/Popliteal Crease  34.6 cm    30 cm  Proximal to Floor at Lateral Plantar Foot  38.2 cm    20 cm Proximal to Floor at Lateral Plantar Foot  26.3 cm    10 cm Proximal to Floor at Lateral Malleoli  21.3 cm    5 cm Proximal to 1st MTP Joint  23.3 cm    Across MTP Joint  23.5 cm           Outpatient [facility] from [facility]/09/2020 [facility]  Lymphedema Life Impact Scale Total Score  33.82 %           [facility] Adult PT Treatment/Exercise - 01/23/20 0001      Manual Therapy   Manual Therapy  Edema management;Manual Lymphatic Drainage (MLD)    Edema Management  stocking is able to get to the groin in appropriate level. Pt reports that she preivously had not pulled it up as high as it can go.     Manual Lymphatic Drainage (MLD)  In supine: short neck, swimming in the terminus, Bil shoulders, 5 diaphragmatic breathes, Bil inguinal nodes, L axillary nodes, anterior interinguinal anastomosis, L inguino-axillary anastomosis, L lateral thigh, medial lateral L thigh, lateral L thigh, bottle neck of the knee, posterior thigh, posterior knee, all surfaces of the lower leg,  Bil malleoli, dorsum of the foot, in R side-lying: lateral L thigh, posterior inter-inguinal anastomosis, lateral L thigh, medial to latera L thigh, lateral L thigh, posterior inter-inguinal anastomosis. Then in supine re-worked all surfaces and deep abodminals.              PT Education - 01/23/20 1709    Education Details  Pt asked about direction with short neck and was given demonstration and performed return demonstration. Pt will continue to wear her thigh high compression garment pulled all the way up to her groin and will purchase compression shorts/spanks prior to her next visit. Demonstrated for pt easier donning of the lower extremity garment.    Person(s) Educated  Patient    Methods  Explanation;Demonstration;Verbal cues    Comprehension  Verbalized understanding;Returned demonstration       PT Short Term Goals -  01/19/20 1848      PT SHORT TERM GOAL #1   Title  Pt will be independent with HEP and self MLD for autonomy of care.    Baseline  pt does not know how to perform MLD    Time  2    Period  Weeks    Status  On-going        PT Long Term Goals - 01/19/20 1849      PT LONG TERM GOAL #1   Title  Patient will have reduction of L distal thigh limb girth by 2 cm within 4 weeks to demonstrate decreased fluid.    Baseline  see measurements .    Time  4    Period  Weeks    Status  On-going      PT LONG TERM GOAL #2   Title  Patient to be properly fitted with compression garment to wear on daily basis.    Baseline  Pt does not have a compression garment. 01/19/2020. pt had thigh high , but may need more proximal compression    Time  4    Period  Weeks    Status  On-going      PT LONG TERM GOAL #3   Title  Patient will be independent in prevention/self-care management principles including self-massage/bandaging and long term management plan for edema.    Baseline  pt is unaware of how to manage edema at this time.    Time  4    Period  Weeks    Status  On-going            Plan - 01/23/20 1606    Clinical Impression Statement  Pt circumferential leg measurements were taken this session with significant reduction since her last session; most likely related to pt is now wearing her garment at the appropriate height. MLD was performed at the LLE this session utilizing the posterior inter-inguinal anastomosis as well as the anterior with extra time spent at the thigh. Pt will benefit from continued POC at this time.    Personal Factors and Comorbidities  Comorbidity 1    Comorbidities  2 lymph node removal    Rehab Potential  Excellent    PT Frequency  2x / week    PT Duration  4 weeks    PT Treatment/Interventions  Therapeutic activities;Therapeutic exercise;Patient/family education;Manual techniques    PT Next Visit Plan  assess shorts.  continue teaching self MLD and performing MLD  for the LLE,  Re-measure and consider possibly begin self bandaging for night?    PT Home Exercise Plan  Access Code: [email or web address]  Consulted and Agree with Plan of Care  Patient       Patient will benefit from skilled therapeutic intervention in order to improve the following deficits and impairments:  Increased edema  Visit Diagnosis: Lymphedema, not elsewhere classified     Problem List Patient Active Problem List   Diagnosis Date Noted  . Obesity, unspecified 03/13/2014  . S/P abdominal hysterectomy 06/30/2013  . H/O Graves' disease 12/30/2012  . Fibroids 12/30/2012    Ander Purpura, PT 01/23/2020, 5:15 PM  [place], [place], [address] Phone: [phone]   Fax:  [phone]  Name: Heidi Soto MRN: [id number] Date of Birth: Feb 06, 1971

## 2020-01-25 ENCOUNTER — Other Ambulatory Visit: Payer: Self-pay

## 2020-01-25 ENCOUNTER — Ambulatory Visit: Payer: BC Managed Care – PPO

## 2020-01-25 DIAGNOSIS — I890 Lymphedema, not elsewhere classified: Secondary | ICD-10-CM

## 2020-01-25 NOTE — Therapy (Signed)
[place], [place], [address] Phone: [phone]   Fax:  [phone]  Physical Therapy Treatment  Patient Details  Name: Heidi Soto MRN: [id number] Date of Birth: 1971/08/20 Referring Provider (PT): Melodye Ped PA   Encounter Date: 01/25/2020  PT End of Session - 01/25/20 1604    Visit Number  6    Number of Visits  9    Date for PT Re-Evaluation  02/13/20    PT Start Time  1603    PT Stop Time  1700    PT Time Calculation (min)  57 min    Activity Tolerance  Patient tolerated treatment well    Behavior During Therapy  [facility] for tasks assessed/performed       Past Medical History:  Diagnosis Date  . Graves disease   . Headache(784.0)   . Hypertension   . Thyroid disease     Past Surgical History:  Procedure Laterality Date  . ABDOMINAL HYSTERECTOMY N/A 06/29/2013   Procedure: HYSTERECTOMY ABDOMINAL;  Surgeon: Elveria Royals, MD;  Location: [facility];  Service: Gynecology;  Laterality: N/A;  . BILATERAL SALPINGECTOMY Bilateral 06/29/2013   Procedure: BILATERAL SALPINGECTOMY;  Surgeon: Elveria Royals, MD;  Location: [facility];  Service: Gynecology;  Laterality: Bilateral;  . NO PAST SURGERIES      There were no vitals filed for this visit.  Subjective Assessment - 01/25/20 1604    Subjective  Pt reports that she went to buy shorts but couldn't find any. She states she remembered that she had some at home and wore them yesterday but not today because it didn't fit under her pants.    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    Patient Stated Goals  Decrease the swelling and learn go manage lymphedema.    Currently in Pain?  No/denies    Multiple Pain Sites  No            LYMPHEDEMA/ONCOLOGY QUESTIONNAIRE - 01/25/20 1610      Left Lower Extremity Lymphedema   20 cm Proximal to Suprapatella  64.7 cm    10 cm Proximal to Suprapatella  52.7 cm    At Midpatella/Popliteal Crease  33.8 cm    30 cm Proximal to Floor at Lateral Plantar Foot  37.9 cm    20 cm Proximal to Floor at Lateral Plantar Foot  26.3 cm    10 cm Proximal to Floor at Lateral Malleoli  21.3 cm    5 cm Proximal to 1st MTP Joint  23.3 cm    Across MTP Joint  23.3 cm           Outpatient Re[facility] from [facility]/09/2020 [facility]  Lymphedema Life Impact Scale Total Score  33.82 %           [facility] Adult PT Treatment/Exercise - 01/25/20 0001      Manual Therapy   Manual Therapy  Manual Lymphatic Drainage (MLD);Soft tissue mobilization    Edema Management  circumferential measurements taken this session.     Soft tissue mobilization  Fibrotic tissue noted under the incision at the proximal thigh; decreased minimally after light STM along the incision up to mid-way along the inguinal ligament.     Manual Lymphatic Drainage (MLD)  In supine: short neck, swimming in the terminus, Bil shoulders, 5 diaphragmatic breathes, Bil inguinal nodes, L axillary nodes, anterior interinguinal anastomosis, L inguino-axillary anastomosis, L lateral thigh,  medial lateral L thigh, lateral L thigh, bottle neck of the knee, posterior thigh, posterior knee, all surfaces of the lower leg, Bil malleoli, dorsum of the foot. Re-worked all surfaces then deep abdominals             PT Education - 01/25/20 1702    Education Details  Pt will continue working on MLD at home and wearing compression. Discussed POC and will decrease to 1x/week for 2 more weeks. She will wear compression for at least 4 weeks post when when received compression garment.    Person(s) Educated  Patient    Methods  Explanation    Comprehension  Verbalized understanding       PT Short Term Goals - 01/19/20 1848      PT SHORT TERM GOAL #1   Title  Pt will be independent with HEP and self MLD for autonomy of care.    Baseline  pt does not know how to perform MLD    Time  2    Period   Weeks    Status  On-going        PT Long Term Goals - 01/19/20 1849      PT LONG TERM GOAL #1   Title  Patient will have reduction of L distal thigh limb girth by 2 cm within 4 weeks to demonstrate decreased fluid.    Baseline  see measurements .    Time  4    Period  Weeks    Status  On-going      PT LONG TERM GOAL #2   Title  Patient to be properly fitted with compression garment to wear on daily basis.    Baseline  Pt does not have a compression garment. 01/19/2020. pt had thigh high , but may need more proximal compression    Time  4    Period  Weeks    Status  On-going      PT LONG TERM GOAL #3   Title  Patient will be independent in prevention/self-care management principles including self-massage/bandaging and long term management plan for edema.    Baseline  pt is unaware of how to manage edema at this time.    Time  4    Period  Weeks    Status  On-going            Plan - 01/25/20 1603    Clinical Impression Statement  Pt continues with decreased circumferential measurements from her initial evaluation. On visual inspection from the front and the back of Bil thights pt appears equal width/shape bilaterally demonstrating improvement in edema. Fibrotic tissue noted under the incision at the proximal thigh were lymph nodes were removed; slight decrease in stiffness following light STM. Pt will benefit from decrease to 1x/week for 2 more weeks at this time.    Personal Factors and Comorbidities  Comorbidity 1    Comorbidities  2 lymph node removal    Rehab Potential  Excellent    PT Frequency  2x / week    PT Duration  4 weeks    PT Treatment/Interventions  Therapeutic activities;Therapeutic exercise;Patient/family education;Manual techniques    PT Next Visit Plan  Possibly assess self MLD, continue with MLD and STM to fibrotic tissue proximal thigh nera incision. Re-measure each visit.    PT Home Exercise Plan  Access Code: [email or web address]    Consulted and Agree with Plan  of Care  Patient       Patient will benefit from skilled therapeutic  intervention in order to improve the following deficits and impairments:  Increased edema  Visit Diagnosis: Lymphedema, not elsewhere classified     Problem List Patient Active Problem List   Diagnosis Date Noted  . Obesity, unspecified 03/13/2014  . S/P abdominal hysterectomy 06/30/2013  . H/O Graves' disease 12/30/2012  . Fibroids 12/30/2012    Ander Purpura, PT 01/25/2020, 5:05 PM  [place], [place], [address] Phone: [phone]   Fax:  [phone]  Name: Heidi Soto MRN: [id number] Date of Birth: 14-Nov-1971

## 2020-02-02 ENCOUNTER — Encounter: Payer: BC Managed Care – PPO | Admitting: Physical Therapy

## 2020-02-08 ENCOUNTER — Ambulatory Visit: Payer: BC Managed Care – PPO

## 2020-02-08 ENCOUNTER — Other Ambulatory Visit: Payer: Self-pay

## 2020-02-08 DIAGNOSIS — I890 Lymphedema, not elsewhere classified: Secondary | ICD-10-CM

## 2020-02-08 NOTE — Therapy (Signed)
[place], [place], [address] Phone: [phone]   Fax:  [phone]  Physical Therapy Treatment  Patient Details  Name: Heidi Soto MRN: [id number] Date of Birth: [birth year] Referring Provider (PT): Melodye Ped PA   Encounter Date: 02/08/2020  PT End of Session - 02/08/20 1611    Visit Number  7    Number of Visits  9    Date for PT Re-Evaluation  02/13/20    PT Start Time  [id number]    PT Stop Time  1654    PT Time Calculation (min)  44 min    Activity Tolerance  Patient tolerated treatment well    Behavior During Therapy  [facility] for tasks assessed/performed       Past Medical History:  Diagnosis Date  . Graves disease   . Headache(784.0)   . Hypertension   . Thyroid disease     Past Surgical History:  Procedure Laterality Date  . ABDOMINAL HYSTERECTOMY N/A 06/29/2013   Procedure: HYSTERECTOMY ABDOMINAL;  Surgeon: Elveria Royals, MD;  Location: [facility];  Service: Gynecology;  Laterality: N/A;  . BILATERAL SALPINGECTOMY Bilateral 06/29/2013   Procedure: BILATERAL SALPINGECTOMY;  Surgeon: Elveria Royals, MD;  Location: [facility];  Service: Gynecology;  Laterality: Bilateral;  . NO PAST SURGERIES      There were no vitals filed for this visit.  Subjective Assessment - 02/08/20 1612    Subjective  Pt reports that she has been wearing her compression stocking and has not had any difficulty with swelling. She has been wearing her stocking and states that she feels her clothes is getting looser.    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    Patient Stated Goals  Decrease the swelling and learn go manage lymphedema.    Currently in Pain?  No/denies    Multiple Pain Sites  No            LYMPHEDEMA/ONCOLOGY QUESTIONNAIRE - 02/08/20 1621      Left Lower Extremity Lymphedema   20 cm Proximal to Suprapatella  63 cm    10 cm Proximal to Suprapatella  52.3  cm    At Midpatella/Popliteal Crease  33.8 cm    30 cm Proximal to Floor at Lateral Plantar Foot  36 cm    20 cm Proximal to Floor at Lateral Plantar Foot  26.3 cm    10 cm Proximal to Floor at Lateral Malleoli  21.3 cm    5 cm Proximal to 1st MTP Joint  23 cm    Across MTP Joint  23 cm           Outpatient Re[facility] from [facility]/09/[facility]  Lymphedema Life Impact Scale Total Score  33.82 %           [facility] Adult PT Treatment/Exercise - 02/08/20 0001      Manual Therapy   Manual Therapy  Manual Lymphatic Drainage (MLD);Edema management    Edema Management  circumferential measurements taken this session.     Manual Lymphatic Drainage (MLD)  In supine: short neck, swimming in the terminus, Bil shoulders, 5 diaphragmatic breathes, Bil inguinal nodes, L axillary nodes, anterior interinguinal anastomosis, L inguino-axillary anastomosis, L lateral thigh, medial lateral L thigh, lateral L thigh, bottle neck of the knee, posterior thigh, posterior knee, all surfaces of the lower leg, Bil malleoli, dorsum of the foot. Re-worked all surfaces then deep abdominals  PT Education - 02/08/20 1659    Education Details  Pt will continue to wear her compression garment until Sunday and then not wear it all of Sunday and Monday in order to assess her need for continueing constant compression.    Person(s) Educated  Patient    Methods  Explanation    Comprehension  Verbalized understanding       PT Short Term Goals - 01/19/20 1848      PT SHORT TERM GOAL #1   Title  Pt will be independent with HEP and self MLD for autonomy of care.    Baseline  pt does not know how to perform MLD    Time  2    Period  Weeks    Status  On-going        PT Long Term Goals - 01/19/20 1849      PT LONG TERM GOAL #1   Title  Patient will have reduction of L distal thigh limb girth by 2 cm within 4 weeks to demonstrate decreased fluid.    Baseline  see  measurements .    Time  4    Period  Weeks    Status  On-going      PT LONG TERM GOAL #2   Title  Patient to be properly fitted with compression garment to wear on daily basis.    Baseline  Pt does not have a compression garment. 01/19/2020. pt had thigh high , but may need more proximal compression    Time  4    Period  Weeks    Status  On-going      PT LONG TERM GOAL #3   Title  Patient will be independent in prevention/self-care management principles including self-massage/bandaging and long term management plan for edema.    Baseline  pt is unaware of how to manage edema at this time.    Time  4    Period  Weeks    Status  On-going            Plan - 02/08/20 1607    Clinical Impression Statement  Pt continues with decreased circumferential measurements from her initial evaluation. MLD was performed today to the LLE. No fibrotic tissue was noted at the incision area on the medial L thigh. Pt will benefit from one more visit. She is going to not wear her compression for 1 day prior to her physical therapy appointment to assess her need for continuing compression.    Personal Factors and Comorbidities  Comorbidity 1    Comorbidities  2 lymph node removal    Rehab Potential  Excellent    PT Frequency  2x / week    PT Duration  4 weeks    PT Treatment/Interventions  Therapeutic activities;Therapeutic exercise;Patient/family education;Manual techniques    PT Next Visit Plan  Circumferential measurements and discharge, continue with MLD    PT Home Exercise Plan  Access Code: [email or web address]    Consulted and Agree with Plan of Care  Patient       Patient will benefit from skilled therapeutic intervention in order to improve the following deficits and impairments:  Increased edema  Visit Diagnosis: Lymphedema, not elsewhere classified     Problem List Patient Active Problem List   Diagnosis Date Noted  . Obesity, unspecified 03/13/2014  . S/P abdominal hysterectomy 06/30/2013   . H/O Graves' disease 12/30/2012  . Fibroids 12/30/2012    Gretel Acre [name],PT 02/08/2020, 5:00 PM  [facility]  [place], [place], [address] Phone: [phone]   Fax:  [phone]  Name: Heidi Soto MRN: [id number] Date of Birth: 1971/04/08

## 2020-02-13 ENCOUNTER — Ambulatory Visit: Payer: BC Managed Care – PPO | Attending: General Surgery

## 2020-02-13 ENCOUNTER — Other Ambulatory Visit: Payer: Self-pay

## 2020-02-13 DIAGNOSIS — I890 Lymphedema, not elsewhere classified: Secondary | ICD-10-CM | POA: Diagnosis present

## 2020-02-13 NOTE — Therapy (Addendum)
[place], [place], [address] Phone: [phone]   Fax:  [phone]  Physical Therapy Discharge Note  Patient Details  Name: Heidi Soto MRN: [id number] Date of Birth: 07-13-1971 Referring Provider (PT): Melodye Ped PA   Encounter Date: 02/13/2020  PT End of Session - 02/13/20 1610    Visit Number  8    Number of Visits  9    Date for PT Re-Evaluation  02/13/20    PT Start Time  5462    PT Stop Time  1658    PT Time Calculation (min)  48 min    Activity Tolerance  Patient tolerated treatment well    Behavior During Therapy  [facility] for tasks assessed/performed       Past Medical History:  Diagnosis Date  . Graves disease   . Headache(784.0)   . Hypertension   . Thyroid disease     Past Surgical History:  Procedure Laterality Date  . ABDOMINAL HYSTERECTOMY N/A 06/29/2013   Procedure: HYSTERECTOMY ABDOMINAL;  Surgeon: Elveria Royals, MD;  Location: [facility];  Service: Gynecology;  Laterality: N/A;  . BILATERAL SALPINGECTOMY Bilateral 06/29/2013   Procedure: BILATERAL SALPINGECTOMY;  Surgeon: Elveria Royals, MD;  Location: [facility];  Service: Gynecology;  Laterality: Bilateral;  . NO PAST SURGERIES      There were no vitals filed for this visit.  Subjective Assessment - 02/13/20 1611    Subjective  Pt reports that she went without her compression garment yesterday and today. She states that she did her workout on Sunday and felt pretty good without any increase in pain. She has been wearing her rainboots all day today and states that her leg is feeling a little different today but she doens't know if that has to do with all the exercise she did or if she is experiencing more swelling.    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    Patient Stated Goals  Decrease the swelling and learn go manage lymphedema.    Currently in Pain?  No/denies    Multiple Pain  Sites  No            LYMPHEDEMA/ONCOLOGY QUESTIONNAIRE - 02/13/20 1612      Left Lower Extremity Lymphedema   20 cm Proximal to Suprapatella  63.8 cm    10 cm Proximal to Suprapatella  51.2 cm    At Midpatella/Popliteal Crease  34.2 cm    30 cm Proximal to Floor at Lateral Plantar Foot  38 cm    20 cm Proximal to Floor at Lateral Plantar Foot  28.2 cm    10 cm Proximal to Floor at Lateral Malleoli  21.8 cm    5 cm Proximal to 1st MTP Joint  22.5 cm    Across MTP Joint  23.3 cm           Outpatient Re[facility] from [facility]/09/2020 [facility]  Lymphedema Life Impact Scale Total Score  33.82 %           [facility] Adult PT Treatment/Exercise - 02/13/20 0001      Manual Therapy   Manual Therapy  Manual Lymphatic Drainage (MLD);Edema management    Edema Management  circumferential measurements taken this session.     Manual Lymphatic Drainage (MLD)  In supine: short neck, swimming in the terminus, Bil shoulders, 5 diaphragmatic breathes, Bil inguinal nodes, L axillary nodes, anterior interinguinal anastomosis, L inguino-axillary  anastomosis, L lateral thigh, medial lateral L thigh, lateral L thigh, bottle neck of the knee, posterior thigh, posterior knee, all surfaces of the lower leg, Bil malleoli, dorsum of the foot. Re-worked all surfaces then deep abdominals             PT Education - 02/13/20 1622    Education Details  Pt will continue to wear her compression garment for the next 3 months especially when at work and when exercising to acclimate her body to dependent position to see if this doesn't help with edema in the L leg. She will return to physical therapy as needed if she has increased edema that she is unable to reduce.    Person(s) Educated  Patient    Methods  Explanation    Comprehension  Verbalized understanding       PT Short Term Goals - 02/13/20 1623      PT SHORT TERM GOAL #1   Title  Pt will be independent with HEP and  self MLD for autonomy of care.    Baseline  Pt has been performing exercises and MLD at home.    Status  Achieved        PT Long Term Goals - 02/13/20 1623      PT LONG TERM GOAL #1   Title  Patient will have reduction of L distal thigh limb girth by 2 cm within 4 weeks to demonstrate decreased fluid.    Baseline  See measurements pt has met her goal.    Status  Achieved      PT LONG TERM GOAL #2   Title  Patient to be properly fitted with compression garment to wear on daily basis.    Baseline  Pt has a compression garment for her leg that she wears daily.    Status  Achieved      PT LONG TERM GOAL #3   Title  Patient will be independent in prevention/self-care management principles including self-massage/bandaging and long term management plan for edema.    Baseline  Pt states that the pieces of CDT including MLD, exercise and compression    Status  Achieved            Plan - 02/13/20 1610    Clinical Impression Statement  Pt has met all of her goals. She continues to swell without wearing compression slowly especially after being up at work and exercising. She will wear her compression garment when working out and when at work. She will leave it off to acclimate her lymphatic system to a dependent unsupported position unless she starts to notice an increase in edema. If she notices any increased swelling, aching or pain she will begin again with daily MLD and compression. Pt will return to physical therapy if she is unable to decrease edema after performing daily MLD, exercise and compression. Pt will be discharged at this time.    Personal Factors and Comorbidities  Comorbidity 1    Comorbidities  2 lymph node removal    Rehab Potential  Excellent    PT Frequency  2x / week    PT Duration  4 weeks    PT Treatment/Interventions  Therapeutic activities;Therapeutic exercise;Patient/family education;Manual techniques    PT Next Visit Plan  Pt will be discharged today.    PT  Home Exercise Plan  Access Code: [email or web address]    Consulted and Agree with Plan of Care  Patient       Patient will benefit from skilled therapeutic  intervention in order to improve the following deficits and impairments:  Increased edema  Visit Diagnosis: Lymphedema, not elsewhere classified     Problem List Patient Active Problem List   Diagnosis Date Noted  . Obesity, unspecified 03/13/2014  . S/P abdominal hysterectomy 06/30/2013  . H/O Graves' disease 12/30/2012  . Fibroids 12/30/2012   PHYSICAL THERAPY DISCHARGE SUMMARY  Plan: Patient agrees to discharge.  Patient goals were met. Patient is being discharged due to meeting the stated rehab goals.  ?????       Ander Purpura, PT 02/13/2020, 4:59 PM  [place], [place], [address] Phone: [phone]   Fax:  [phone]  Name: Heidi Soto MRN: [id number] Date of Birth: 24-Dec-1970

## 2021-04-09 ENCOUNTER — Telehealth: Payer: Self-pay | Admitting: Family Medicine

## 2021-04-09 DIAGNOSIS — F418 Other specified anxiety disorders: Secondary | ICD-10-CM

## 2021-04-09 MED ORDER — ALPRAZOLAM 0.5 MG PO TABS: 0.2500 mg | ORAL_TABLET | Freq: Two times a day (BID) | ORAL | 0 refills | Status: DC | PRN

## 2021-04-09 NOTE — Telephone Encounter (Signed)
Patient has a cancer scan on Monday and would like to know if anything can be called in for anxiety - just 1 pill - please advise

## 2021-04-29 ENCOUNTER — Other Ambulatory Visit: Payer: Self-pay

## 2021-04-29 ENCOUNTER — Telehealth (INDEPENDENT_AMBULATORY_CARE_PROVIDER_SITE_OTHER): Payer: BC Managed Care – PPO | Admitting: Registered Nurse

## 2021-04-29 ENCOUNTER — Encounter: Payer: Self-pay | Admitting: Registered Nurse

## 2021-04-29 ENCOUNTER — Telehealth: Payer: Self-pay | Admitting: Family Medicine

## 2021-04-29 VITALS — Temp 97.6°F

## 2021-04-29 DIAGNOSIS — U071 COVID-19: Secondary | ICD-10-CM

## 2021-04-29 MED ORDER — MOLNUPIRAVIR 200 MG PO CAPS: 800.0000 mg | ORAL_CAPSULE | Freq: Two times a day (BID) | ORAL | 0 refills | Status: DC

## 2021-04-29 NOTE — Telephone Encounter (Signed)
Patient tested positive yesterday for Covid - Sore throat, cough, congestion - she would like to know if there is something that can be called in to help with the symptoms.

## 2021-04-29 NOTE — Telephone Encounter (Signed)
Pt has been scheduled for a video visit with Richard this afternoon.

## 2021-04-29 NOTE — Patient Instructions (Signed)
° ° ° °  If you have lab work done today you will be contacted with your lab results within the next 2 weeks.  If you have not heard from us then please contact us. The fastest way to get your results is to register for My Chart. ° ° °IF you received an x-ray today, you will receive an invoice from [facility]. Please contact [facility] at [phone] with questions or concerns regarding your invoice.  ° °IF you received labwork today, you will receive an invoice from [facility]. Please contact [facility] at [phone] with questions or concerns regarding your invoice.  ° °Our billing staff will not be able to assist you with questions regarding bills from these companies. ° °You will be contacted with the lab results as soon as they are available. The fastest way to get your results is to activate your My Chart account. Instructions are located on the last page of this paperwork. If you have not heard from us regarding the results in 2 weeks, please contact this office. °  ° ° ° °

## 2021-04-30 NOTE — Telephone Encounter (Signed)
Great. Thank you.

## 2021-10-05 NOTE — Progress Notes (Signed)
Telemedicine Encounter- SOAP NOTE Established Patient  This telephone encounter was conducted with the patient's (or proxy's) verbal consent via audio telecommunications: yes/no: Yes Patient was instructed to have this encounter in a suitably private space; and to only have persons present to whom they give permission to participate. In addition, patient identity was confirmed by use of name plus two identifiers (DOB and address).  I discussed the limitations, risks, security and privacy concerns of performing an evaluation and management service by telephone and the availability of in person appointments. I also discussed with the patient that there may be a patient responsible charge related to this service. The patient expressed understanding and agreed to proceed.  I spent a total of 16 minutes talking with the patient or their proxy.  Patient at home Provider in office  Participants: Kathrin Ruddy, NP and Coralee North  Chief Complaint  Patient presents with   Covid Positive    Patient states she tested positive for Covid yesterday with two at home test. Sore throat , cough,sneezing, and nasal and chest congestion.She has took some OTC cold and cough but not much relief from saturday.    Subjective   AAIRAH NEGRETTE is a 50 y.o. established patient. Telephone visit today for covid+  HPI Symptoms onset 3-4 days ago. Tested positive yesterday.  Notes sore throat, cough, sneezing, rhinitis. Feels like congestion has started migrating towards chest, cough becoming productive.  Has been taking OTC cough and cold meds - some relief, but limited.   No immunizations on file.   Denies chest pain, shob, doe, nvd  Patient Active Problem List   Diagnosis Date Noted   Obesity, unspecified 03/13/2014   S/P abdominal hysterectomy 06/30/2013   H/O Graves' disease 12/30/2012   Fibroids 12/30/2012    Past Medical History:  Diagnosis Date   Graves disease    Headache(784.0)     Hypertension    Thyroid disease     Current Outpatient Medications  Medication Sig Dispense Refill   levothyroxine (SYNTHROID) 88 MCG tablet TAKE 1 TABLET DAILY BEFORE BREAKFAST 90 tablet 0   losartan (COZAAR) 50 MG tablet Take 50 mg by mouth daily.     Molnupiravir 200 MG CAPS Take 4 capsules (800 mg total) by mouth in the morning and at bedtime. 40 capsule 0   Multiple Vitamin (MULTIVITAMIN WITH MINERALS) TABS Take 1 tablet by mouth daily. Reported on 05/19/2016     ALPRAZolam (XANAX) 0.5 MG tablet Take 0.5-1 tablets (0.25-0.5 mg total) by mouth 2 (two) times daily as needed for anxiety (prior to procedure). (Patient not taking: Reported on 04/29/2021) 2 tablet 0   hydrochlorothiazide (MICROZIDE) 12.5 MG capsule Take 1 capsule (12.5 mg total) by mouth daily. (Patient not taking: Reported on 04/29/2021) 90 capsule 1   meloxicam (MOBIC) 7.5 MG tablet TAKE 1 TABLET(7.5 MG) BY MOUTH DAILY (Patient not taking: Reported on 04/29/2021) 30 tablet 0   No current facility-administered medications for this visit.    Allergies  Allergen Reactions   Bactroban [Mupirocin Calcium] Rash    Social History   Socioeconomic History   Marital status: Single    Spouse name: Not on file   Number of children: Not on file   Years of education: Not on file   Highest education level: Not on file  Occupational History   Not on file  Tobacco Use   Smoking status: Never   Smokeless tobacco: Never  Substance and Sexual Activity   Alcohol use: No  Comment: "not often"   Drug use: No   Sexual activity: Yes    Partners: Male  Other Topics Concern   Not on file  Social History Narrative   Not on file   Social Determinants of Health   Financial Resource Strain: Not on file  Food Insecurity: Not on file  Transportation Needs: Not on file  Physical Activity: Not on file  Stress: Not on file  Social Connections: Not on file  Intimate Partner Violence: Not on file    ROS Per hpi  Objective    Vitals as reported by the patient: Today's Vitals   04/29/21 1542  Temp: 97.6 F (36.4 C)    Annmargaret was seen today for covid positive.  Diagnoses and all orders for this visit:  COVID-19 -     Molnupiravir 200 MG CAPS; Take 4 capsules (800 mg total) by mouth in the morning and at bedtime.   PLAN Discussed options with pt. Opt for molnupiravir. Sig as above. Continue supportive care. Follow up precautions reviewed with pt.  Isolation guidelines reviewed with pt. Discussed risks, benefits, AE, and alternatives to plan. Pt voices understanding. Patient encouraged to call clinic with any questions, comments, or concerns.   I discussed the assessment and treatment plan with the patient. The patient was provided an opportunity to ask questions and all were answered. The patient agreed with the plan and demonstrated an understanding of the instructions.   The patient was advised to call back or seek an in-person evaluation if the symptoms worsen or if the condition fails to improve as anticipated.  I provided 16 minutes of non-face-to-face time during this encounter.  Maximiano Coss, NP

## 2022-02-03 ENCOUNTER — Other Ambulatory Visit: Payer: Self-pay | Admitting: Family Medicine

## 2022-02-03 DIAGNOSIS — Z1231 Encounter for screening mammogram for malignant neoplasm of breast: Secondary | ICD-10-CM

## 2022-02-04 ENCOUNTER — Other Ambulatory Visit: Payer: Self-pay

## 2022-02-04 ENCOUNTER — Ambulatory Visit
Admission: RE | Admit: 2022-02-04 | Discharge: 2022-02-04 | Disposition: A | Discharge status: Home / Self Care | Payer: BC Managed Care – PPO | Source: Ambulatory Visit | Attending: Family Medicine | Admitting: Family Medicine

## 2022-02-04 DIAGNOSIS — Z1231 Encounter for screening mammogram for malignant neoplasm of breast: Secondary | ICD-10-CM

## 2022-03-26 ENCOUNTER — Encounter: Payer: Self-pay | Admitting: Family Medicine

## 2022-03-26 ENCOUNTER — Ambulatory Visit (INDEPENDENT_AMBULATORY_CARE_PROVIDER_SITE_OTHER): Payer: BC Managed Care – PPO | Admitting: Family Medicine

## 2022-03-26 VITALS — BP 138/80 | HR 78 | Temp 98.2°F | Resp 16 | Ht 64.0 in | Wt 256.8 lb

## 2022-03-26 DIAGNOSIS — N951 Menopausal and female climacteric states: Secondary | ICD-10-CM | POA: Diagnosis not present

## 2022-03-26 DIAGNOSIS — F418 Other specified anxiety disorders: Secondary | ICD-10-CM | POA: Diagnosis not present

## 2022-03-26 DIAGNOSIS — E039 Hypothyroidism, unspecified: Secondary | ICD-10-CM

## 2022-03-26 DIAGNOSIS — M25562 Pain in left knee: Secondary | ICD-10-CM

## 2022-03-26 MED ORDER — MELOXICAM 7.5 MG PO TABS: ORAL_TABLET | ORAL | 0 refills | Status: DC

## 2022-03-26 MED ORDER — ALPRAZOLAM 0.5 MG PO TABS: 0.2500 mg | ORAL_TABLET | Freq: Two times a day (BID) | ORAL | 0 refills | Status: DC | PRN

## 2022-03-26 MED ORDER — GABAPENTIN 100 MG PO CAPS: 100.0000 mg | ORAL_CAPSULE | Freq: Every day | ORAL | 3 refills | Status: DC

## 2022-03-26 NOTE — Patient Instructions (Addendum)
Short term meloxicam if needed (not to be combined with ibuprofen), but discuss next steps for your knee with Dr. Mardelle Matte.  ? ?I will check some labs, but likely hot flushes. See info below. Can try low dose gabapentin for now. Initially start '100mg'$  at night, then after 1 week can increase to 2 pills if not effective. Option to increase once more time after another week if needed. If daytime symptoms, can take 1 in the morning as well. Recheck 6 weeks.  ? ?Menopause ?Menopause is the normal time of a woman's life when menstrual periods stop completely. It marks the natural end to a woman's ability to become pregnant. It can be defined as the absence of a menstrual period for 12 months without another medical cause. The transition to menopause (perimenopause) most often happens between the ages of 71 and 67, and can last for many years. During perimenopause, hormone levels change in your body, which can cause symptoms and affect your health. Menopause may increase your risk for: ?Weakened bones (osteoporosis), which causes fractures. ?Depression. ?Hardening and narrowing of the arteries (atherosclerosis), which can cause heart attacks and strokes. ?What are the causes? ?This condition is usually caused by a natural change in hormone levels that happens as you get older. The condition may also be caused by changes that are not natural, including: ?Surgery to remove both ovaries (surgical menopause). ?Side effects from some medicines, such as chemotherapy used to treat cancer (chemical menopause). ?What increases the risk? ?This condition is more likely to start at an earlier age if you have certain medical conditions or have undergone treatments, including: ?A tumor of the pituitary gland in the brain. ?A disease that affects the ovaries and hormones. ?Certain cancer treatments, such as chemotherapy or hormone therapy, or radiation therapy on the pelvis. ?Heavy smoking and excessive alcohol use. ?Family history of  early menopause. ?This condition is also more likely to develop earlier in women who are very thin. ?What are the signs or symptoms? ?Symptoms of this condition include: ?Hot flashes. ?Irregular menstrual periods. ?Night sweats. ?Changes in feelings about sex. This could be a decrease in sex drive or an increased discomfort around your sexuality. ?Vaginal dryness and thinning of the vaginal walls. This may cause painful sex. ?Dryness of the skin and development of wrinkles. ?Headaches. ?Problems sleeping (insomnia). ?Mood swings or irritability. ?Memory problems. ?Weight gain. ?Hair growth on the face and chest. ?Bladder infections or problems with urinating. ?How is this diagnosed? ?This condition is diagnosed based on your medical history, a physical exam, your age, your menstrual history, and your symptoms. Hormone tests may also be done. ?How is this treated? ?In some cases, no treatment is needed. You and your health care provider should make a decision together about whether treatment is necessary. Treatment will be based on your individual condition and preferences. Treatment for this condition focuses on managing symptoms. Treatment may include: ?Menopausal hormone therapy (MHT). ?Medicines to treat specific symptoms or complications. ?Acupuncture. ?Vitamin or herbal supplements. ?Before starting treatment, make sure to let your health care provider know if you have a personal or family history of these conditions: ?Heart disease. ?Breast cancer. ?Blood clots. ?Diabetes. ?Osteoporosis. ?Follow these instructions at home: ?Lifestyle ?Do not use any products that contain nicotine or tobacco, such as cigarettes, e-cigarettes, and chewing tobacco. If you need help quitting, ask your health care provider. ?Get at least 30 minutes of physical activity on 5 or more days each week. ?Avoid alcoholic and caffeinated beverages, as well  as spicy foods. This may help prevent hot flashes. ?Get 7-8 hours of sleep each  night. ?If you have hot flashes, try: ?Dressing in layers. ?Avoiding things that may trigger hot flashes, such as spicy food, warm places, or stress. ?Taking slow, deep breaths when a hot flash starts. ?Keeping a fan in your home and office. ?Find ways to manage stress, such as deep breathing, meditation, or journaling. ?Consider going to group therapy with other women who are having menopause symptoms. Ask your health care provider about recommended group therapy meetings. ?Eating and drinking ? ?Eat a healthy, balanced diet that contains whole grains, lean protein, low-fat dairy, and plenty of fruits and vegetables. ?Your health care provider may recommend adding more soy to your diet. Foods that contain soy include tofu, tempeh, and soy milk. ?Eat plenty of foods that contain calcium and vitamin D for bone health. Items that are rich in calcium include low-fat milk, yogurt, beans, almonds, sardines, broccoli, and kale. ?Medicines ?Take over-the-counter and prescription medicines only as told by your health care provider. ?Talk with your health care provider before starting any herbal supplements. If prescribed, take vitamins and supplements as told by your health care provider. ?General instructions ? ?Keep track of your menstrual periods, including: ?When they occur. ?How heavy they are and how long they last. ?How much time passes between periods. ?Keep track of your symptoms, noting when they start, how often you have them, and how long they last. ?Use vaginal lubricants or moisturizers to help with vaginal dryness and improve comfort during sex. ?Keep all follow-up visits. This is important. This includes any group therapy or counseling. ?Contact a health care provider if: ?You are still having menstrual periods after age 19. ?You have pain during sex. ?You have not had a period for 12 months and you develop vaginal bleeding. ?Get help right away if you have: ?Severe depression. ?Excessive vaginal  bleeding. ?Pain when you urinate. ?A fast or irregular heartbeat (palpitations). ?Severe headaches. ?Abdominal pain or severe indigestion. ?Summary ?Menopause is a normal time of life when menstrual periods stop completely. It is usually defined as the absence of a menstrual period for 12 months without another medical cause. ?The transition to menopause (perimenopause) most often happens between the ages of 64 and 49 and can last for several years. ?Symptoms can be managed through medicines, lifestyle changes, and complementary therapies such as acupuncture. ?Eat a balanced diet that is rich in nutrients to promote bone health and heart health and to manage symptoms during menopause. ?This information is not intended to replace advice given to you by your health care provider. Make sure you discuss any questions you have with your health care provider. ?Document Revised: 08/31/2020 Document Reviewed: 05/17/2020 ?Elsevier Patient Education ? 2022 [employer]. ? ? ? ? ?

## 2022-03-26 NOTE — Progress Notes (Signed)
? ?Subjective:  ?Patient ID: Heidi Soto, female    DOB: 08-27-1971  Age: 51 y.o. MRN: [id number] ? ?CC:  ?Chief Complaint  ?Patient presents with  ? Knee Pain  ?  Pt here as follow up was told she has torn LT meniscus and has been seeing PT notes it has improved but wanted to check in with PCP as well, would also like a meloxcicam refill as she notes this has been very helpful with her knee pains notes she had some left from old Rx  ? Menopause  ?  Pt is curious if she is starting menopause due to some hot flashes, no other notable sxs, unsure if we could do lab testing or how to cope with these changes   ? Medication Refill  ?  Pt would like a refill on xanax for her scan next month   ? ? ?HPI ?Heidi Soto presents for concerns above.  Last visit with me in June 2020. No barriers to care. Occupied with treatment of melanoma, and mother passed away.   ?HTN and HLD followed by Dr. Terrence Dupont, including labwork. He has been adjusting thyroid med as well.  ?Most recent labs few weeks ago.  ? ?Left knee pain: ?Pain since end of December 2022. 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 Tight, swollen. Seen by Dr. Mardelle Matte in February. MRI indicated torn meniscus. Decided against surgery initially. In PT currently, 2 sessions. Helping some.  ?Had leftover meloxicam - taking as needed - few days per week, ibuprofen on other days.  ? ?Hot Flushes: ?Noticed past few years, more frequent in past year. Initially at night. Now in morning, during the day at times. Thyroid dosage adjusted by cardiology about 6 months ago - now on 182mg QD.  ?Hysterectomy for fibroids in 2014. Mother went through menopause.  ?Tx: none.  ? ?Alprazolam refill: ?Will be having full body CT for monitoring of melanoma, xanax prior to study has been effective prior for claustrophobia. 1/2 pill worked ok prior. Last filled 04/09/21.  ? ? ?  03/26/2022  ?  2:26 PM 04/29/2021  ?  3:46 PM 05/24/2019  ? 10:07 AM 05/03/2019  ? 10:45 AM 03/29/2019  ?  4:23  PM  ?Depression screen PHQ 2/9  ?Decreased Interest 0 0 0 0 0  ?Down, Depressed, Hopeless 0 0 0 0 0  ?PHQ - 2 Score 0 0 0 0 0  ? ? ?History ?Patient Active Problem List  ? Diagnosis Date Noted  ? Obesity, unspecified 03/13/2014  ? S/P abdominal hysterectomy 06/30/2013  ? H/O Graves' disease 12/30/2012  ? Fibroids 12/30/2012  ? ?Past Medical History:  ?Diagnosis Date  ? Graves disease   ? Headache(784.0)   ? Hypertension   ? Thyroid disease   ? ?Past Surgical History:  ?Procedure Laterality Date  ? ABDOMINAL HYSTERECTOMY N/A 06/29/2013  ? Procedure: HYSTERECTOMY ABDOMINAL;  Surgeon: VElveria Royals MD;  [facility];  Service: Gynecology;  Laterality: N/A;  ? BILATERAL SALPINGECTOMY Bilateral 06/29/2013  ? Procedure: BILATERAL SALPINGECTOMY;  Surgeon: VElveria Royals MD;  [facility];  Service: Gynecology;  Laterality: Bilateral;  ? NO PAST SURGERIES    ? ?Allergies  ?Allergen Reactions  ? Bactroban [Mupirocin Calcium] Rash  ? ?Prior to Admission medications   ?Medication Sig Start Date End Date Taking? Authorizing Provider  ?ALPRAZolam (XANAX) 0.5 MG tablet Take 0.5-1 tablets (0.25-0.5 mg total) by mouth 2 (two) times daily as needed for anxiety (prior to procedure). ?  Patient taking differently: Take 0.25-0.5 mg by mouth 2 (two) times daily as needed for anxiety (prior to procedure). PRN for scans and procedures 04/09/21  Yes Wendie Agreste, MD  ?loratadine (CLARITIN) 10 MG tablet Take 10 mg by mouth daily.   Yes [provider]  ?meloxicam (MOBIC) 7.5 MG tablet TAKE 1 TABLET(7.5 MG) BY MOUTH DAILY 08/24/19  Yes Wendie Agreste, MD  ?rosuvastatin (CRESTOR) 10 MG tablet Take 5 mg by mouth daily. Take one half daily   Yes [provider]  ?amLODipine (NORVASC) 5 MG tablet Take 5 mg by mouth daily. 03/18/22   [provider]  ?levothyroxine (SYNTHROID) 88 MCG tablet TAKE 1 TABLET DAILY BEFORE BREAKFAST ?Patient taking differently: 112 mcg. TAKE 1 TABLET DAILY BEFORE BREAKFAST 09/15/19    Wendie Agreste, MD  ?losartan (COZAAR) 50 MG tablet Take 100 mg by mouth daily.    [provider]  ?Molnupiravir 200 MG CAPS Take 4 capsules (800 mg total) by mouth in the morning and at bedtime. 04/29/21   Maximiano Coss, NP  ?Multiple Vitamin (MULTIVITAMIN WITH MINERALS) TABS Take 1 tablet by mouth daily. Reported on 05/19/2016    [provider]  ? ?Social History  ? ?Socioeconomic History  ? Marital status: Single  ?  Spouse name: Not on file  ? Number of children: Not on file  ? Years of education: Not on file  ? Highest education level: Not on file  ?Occupational History  ? Not on file  ?Tobacco Use  ? Smoking status: Never  ? Smokeless tobacco: Never  ?Substance and Sexual Activity  ? Alcohol use: No  ?  Comment: "not often"  ? Drug use: No  ? Sexual activity: Yes  ?  Partners: Male  ?Other Topics Concern  ? Not on file  ?Social History Narrative  ? Not on file  ? ?Social Determinants of Health  ? ?Financial Resource Strain: Not on file  ?Food Insecurity: Not on file  ?Transportation Needs: Not on file  ?Physical Activity: Not on file  ?Stress: Not on file  ?Social Connections: Not on file  ?Intimate Partner Violence: Not on file  ? ? ?Review of Systems ? ? ?Objective:  ? ?Vitals:  ? 03/26/22 1420  ?BP: 138/80  ?Pulse: 78  ?Resp: 16  ?Temp: 98.2 ?F (36.8 ?C)  ?TempSrc: Temporal  ?SpO2: 100%  ?Weight: 256 lb 12.8 oz (116.5 kg)  ?Height: '5\' 4"'$  (1.626 m)  ? ?Physical Exam ?Vitals reviewed.  ?Constitutional:   ?   General: She is not in acute distress. ?   Appearance: Normal appearance. She is well-developed.  ?HENT:  ?   Head: Normocephalic and atraumatic.  ?Cardiovascular:  ?   Rate and Rhythm: Normal rate.  ?Pulmonary:  ?   Effort: Pulmonary effort is normal.  ?Musculoskeletal:  ?   Cervical back: Neck supple. No tenderness.  ?   Comments: Left knee, skin intact, no erythema, no wounds.  Slight tender along the medial joint line.  Minimal discomfort with varus testing medially, valgus  testing laterally.  Seated [place] without appreciable discomfort.  Negative drawer.  Neurovascular intact distally.  ?Neurological:  ?   Mental Status: She is alert and oriented to person, place, and time.  ?Psychiatric:     ?   Mood and Affect: Mood normal.  ? ?48 minutes spent during visit, including chart review, counseling and assimilation of information, exam, discussion of plan including different treatment options for hot flashes, risks and benefits,  and chart completion.  ? ? ?Assessment & Plan:  ?Heidi Soto is a 51 y.o. female . ?Left knee pain, unspecified chronicity - Plan: meloxicam (MOBIC) 7.5 MG tablet ? -With history of meniscal tears above.  Short-term meloxicam provided but recommended further decisions on treatment options to be discussed with her surgeon.  Continue physical therapy for now.  Potential side effects and risks of meloxicam discussed including need for short-term use. ? ?Situational anxiety - Plan: ALPRAZolam (XANAX) 0.5 MG tablet ? -Refilled alprazolam for CT scan, claustrophobia, situational anxiety regarding a scan.  Denies other anxiety. ? ?Menopausal hot flushes - Plan: TSH, CBC, FSH, gabapentin (NEURONTIN) 100 MG capsule ?Hypothyroidism, unspecified type - Plan: TSH ? -Hypothyroidism and Synthroid dosing managed by cardiology.  Reports recent increase, has not had repeat testing since that increase.  Suspected menopausal hot flashes but will check TSH, FSH.  Gabapentin low-dose with titration as needed for hot flashes.  Also discussed SSRI or hormonal options but deferred at this time.  Recheck 6 weeks ? ?Meds ordered this encounter  ?Medications  ? ALPRAZolam (XANAX) 0.5 MG tablet  ?  Sig: Take 0.5-1 tablets (0.25-0.5 mg total) by mouth 2 (two) times daily as needed for anxiety (prior to procedure).  ?  Dispense:  2 tablet  ?  Refill:  0  ? meloxicam (MOBIC) 7.5 MG tablet  ?  Sig: TAKE 1 TABLET(7.5 MG) BY MOUTH DAILY as needed  ?  Dispense:  30 tablet  ?  Refill:  0  ?  gabapentin (NEURONTIN) 100 MG capsule  ?  Sig: Take 1-3 capsules (100-300 mg total) by mouth at bedtime.  ?  Dispense:  90 capsule  ?  Refill:  3  ? ?Patient Instructions  ?Short term meloxicam if needed (

## 2022-03-27 LAB — CBC
HCT: 39.7 % (ref 36.0–46.0)
Hemoglobin: 12.8 g/dL (ref 12.0–15.0)
MCHC: 32.2 g/dL (ref 30.0–36.0)
MCV: 85.9 fl (ref 78.0–100.0)
Platelets: 265 10*3/uL (ref 150.0–400.0)
RBC: 4.62 Mil/uL (ref 3.87–5.11)
RDW: 14.3 % (ref 11.5–15.5)
WBC: 7.5 10*3/uL (ref 4.0–10.5)

## 2022-03-27 LAB — FOLLICLE STIMULATING HORMONE: FSH: 11.1 m[IU]/mL

## 2022-03-27 LAB — TSH: TSH: 5.11 u[IU]/mL (ref 0.35–5.50)

## 2022-04-16 ENCOUNTER — Telehealth: Payer: Self-pay

## 2022-04-16 NOTE — Telephone Encounter (Signed)
Received fax request for surgical optimization, pt was seen on 03/26/22 discussed knee pain but no labs or EKG done.  ?Pt has appt on 05/08/22 for 6 week follow up, should pt have separate appt than this follow up for optimization? ?

## 2022-04-17 NOTE — Telephone Encounter (Signed)
Depending on the timing of her knee surgery I think it is fine to shift the May 25 visit to preop evaluation, and should be able to briefly follow-up medication from last visit.  If we find we need more time at that visit, can schedule separate follow-up. ?

## 2022-04-20 ENCOUNTER — Other Ambulatory Visit: Payer: Self-pay | Admitting: Family Medicine

## 2022-04-20 DIAGNOSIS — M25562 Pain in left knee: Secondary | ICD-10-CM

## 2022-04-21 NOTE — Telephone Encounter (Signed)
Notated surgical clearance to be performed 05/08/22 ?

## 2022-05-08 ENCOUNTER — Encounter: Payer: Self-pay | Admitting: Family Medicine

## 2022-05-08 ENCOUNTER — Ambulatory Visit (INDEPENDENT_AMBULATORY_CARE_PROVIDER_SITE_OTHER): Payer: BC Managed Care – PPO | Admitting: Family Medicine

## 2022-05-08 VITALS — BP 122/66 | HR 68 | Temp 98.2°F | Resp 16 | Ht 64.0 in | Wt 253.8 lb

## 2022-05-08 DIAGNOSIS — N951 Menopausal and female climacteric states: Secondary | ICD-10-CM

## 2022-05-08 NOTE — Progress Notes (Signed)
Subjective:  Patient ID: Heidi Soto, female    DOB: 10-26-1971  Age: 51 y.o. MRN: [id number]  CC:  Chief Complaint  Patient presents with   Hot Flashes    Pt here for recheck notes gabapentin has helped, notes working well, will still have some hot flashes but drastically reduced frequency     HPI Heidi Soto presents for   Menopausal hot flushes Started gabapentin last visit in April Helps sleep and less hot flushes. Only few here and there. Usually at night.  No side effects.  S/p hysterectomy.  PT helping for knee pain. Met with surgeon - delaying any surgery for now. Dr. Mardelle Matte.   Results for orders placed or performed in visit on 03/26/22  TSH  Result Value Ref Range   TSH 5.11 0.35 - 5.50 uIU/mL  CBC  Result Value Ref Range   WBC 7.5 4.0 - 10.5 K/uL   RBC 4.62 3.87 - 5.11 Mil/uL   Platelets 265.0 150.0 - 400.0 K/uL   Hemoglobin 12.8 12.0 - 15.0 g/dL   HCT 39.7 36.0 - 46.0 %   MCV 85.9 78.0 - 100.0 fl   MCHC 32.2 30.0 - 36.0 g/dL   RDW 14.3 11.5 - 15.5 %  FSH  Result Value Ref Range   FSH 11.1 mIU/ML      History Patient Active Problem List   Diagnosis Date Noted   Obesity, unspecified 03/13/2014   S/P abdominal hysterectomy 06/30/2013   H/O Graves' disease 12/30/2012   Fibroids 12/30/2012   Past Medical History:  Diagnosis Date   Graves disease    Headache(784.0)    Hypertension    Thyroid disease    Past Surgical History:  Procedure Laterality Date   ABDOMINAL HYSTERECTOMY N/A 06/29/2013   Procedure: HYSTERECTOMY ABDOMINAL;  Surgeon: Elveria Royals, MD;  Location: [facility];  Service: Gynecology;  Laterality: N/A;   BILATERAL SALPINGECTOMY Bilateral 06/29/2013   Procedure: BILATERAL SALPINGECTOMY;  Surgeon: Elveria Royals, MD;  Location: [facility];  Service: Gynecology;  Laterality: Bilateral;   NO PAST SURGERIES     Allergies  Allergen Reactions   Bactroban [Mupirocin Calcium] Rash   Prior to Admission medications   Medication Sig Start  Date End Date Taking? Authorizing Provider  ALPRAZolam Duanne Moron) 0.5 MG tablet Take 0.5-1 tablets (0.25-0.5 mg total) by mouth 2 (two) times daily as needed for anxiety (prior to procedure). 03/26/22  Yes Wendie Agreste, MD  amLODipine (NORVASC) 5 MG tablet Take 5 mg by mouth daily. 03/18/22  Yes [provider]  ezetimibe (ZETIA) 10 MG tablet Take 10 mg by mouth daily. 04/17/22  Yes [provider]  gabapentin (NEURONTIN) 100 MG capsule Take 1-3 capsules (100-300 mg total) by mouth at bedtime. 03/26/22  Yes Wendie Agreste, MD  levothyroxine (SYNTHROID) 88 MCG tablet TAKE 1 TABLET DAILY BEFORE BREAKFAST Patient taking differently: 112 mcg. TAKE 1 TABLET DAILY BEFORE BREAKFAST 09/15/19  Yes Wendie Agreste, MD  loratadine (CLARITIN) 10 MG tablet Take 10 mg by mouth daily.   Yes [provider]  losartan (COZAAR) 50 MG tablet Take 100 mg by mouth daily.   Yes [provider]  meloxicam (MOBIC) 7.5 MG tablet TAKE 1 TABLET(7.5 MG) BY MOUTH DAILY AS NEEDED 04/21/22  Yes Wendie Agreste, MD  Multiple Vitamin (MULTIVITAMIN WITH MINERALS) TABS Take 1 tablet by mouth daily. Reported on 05/19/2016   Yes [provider]   Social History   Socioeconomic History   Marital  status: Single    Spouse name: Not on file   Number of children: Not on file   Years of education: Not on file   Highest education level: Not on file  Occupational History   Not on file  Tobacco Use   Smoking status: Never   Smokeless tobacco: Never  Substance and Sexual Activity   Alcohol use: No    Comment: "not often"   Drug use: No   Sexual activity: Yes    Partners: Male  Other Topics Concern   Not on file  Social History Narrative   Not on file   Social Determinants of Health   Financial Resource Strain: Not on file  Food Insecurity: Not on file  Transportation Needs: Not on file  Physical Activity: Not on file  Stress: Not on file  Social Connections: Not on file   Intimate Partner Violence: Not on file    Review of Systems Per HPI.   Objective:   Vitals:   05/08/22 1432  BP: 122/66  Pulse: 68  Resp: 16  Temp: 98.2 F (36.8 C)  TempSrc: Temporal  SpO2: 97%  Weight: 253 lb 12.8 oz (115.1 kg)  Height: '5\' 4"'  (1.626 m)    Physical Exam Vitals reviewed.  Constitutional:      General: She is not in acute distress.    Appearance: Normal appearance. She is well-developed.  HENT:     Head: Normocephalic and atraumatic.  Cardiovascular:     Rate and Rhythm: Normal rate.  Pulmonary:     Effort: Pulmonary effort is normal.  Neurological:     Mental Status: She is alert and oriented to person, place, and time.  Psychiatric:        Mood and Affect: Mood normal.        Behavior: Behavior normal.    Assessment & Plan:  Heidi Soto is a 51 y.o. female . Menopausal hot flushes  -Improved with gabapentin.  Option of 1-2 at bedtime with potential side effects discussed.  Recheck in 5 months for physical.  Reviewed lab work today.  No questions.  No orders of the defined types were placed in this encounter.  Patient Instructions  You have option of 2 gabapentin at bedtime if needed.     Signed,   Merri Ray, MD [place], [facility] 05/08/22 3:19 PM

## 2022-05-08 NOTE — Patient Instructions (Addendum)
You have option of 2 gabapentin at bedtime if needed.

## 2022-05-09 ENCOUNTER — Telehealth: Payer: Self-pay

## 2022-05-09 NOTE — Telephone Encounter (Signed)
Sent [place] Immunization registry report to scan.

## 2022-05-24 ENCOUNTER — Other Ambulatory Visit: Payer: Self-pay | Admitting: Family Medicine

## 2022-05-24 DIAGNOSIS — M25562 Pain in left knee: Secondary | ICD-10-CM

## 2022-06-23 ENCOUNTER — Other Ambulatory Visit: Payer: Self-pay | Admitting: Family Medicine

## 2022-06-23 DIAGNOSIS — M25562 Pain in left knee: Secondary | ICD-10-CM

## 2022-06-25 ENCOUNTER — Other Ambulatory Visit: Payer: Self-pay | Admitting: Family Medicine

## 2022-06-25 DIAGNOSIS — N951 Menopausal and female climacteric states: Secondary | ICD-10-CM

## 2022-08-06 ENCOUNTER — Other Ambulatory Visit: Payer: Self-pay | Admitting: Family Medicine

## 2022-08-06 DIAGNOSIS — M25562 Pain in left knee: Secondary | ICD-10-CM

## 2022-08-06 MED ORDER — MELOXICAM 7.5 MG PO TABS: ORAL_TABLET | ORAL | 0 refills | Status: DC

## 2022-08-06 NOTE — Telephone Encounter (Signed)
Encourage patient to contact the pharmacy for refills or they can request refills through [facility]  (Please schedule appointment if patient has not been seen in over a year)    [place] TO: [facility] [address] [phone]  MEDICATION NAME & DOSE: meloxicam 7.5 mg  NOTES/COMMENTS FROM PATIENT: Pt only uses PRN; wondering if she can get a 90 day supply      [place] office please notify patient: It takes 48-72 hours to process rx refill requests Ask patient to call pharmacy to ensure rx is ready before heading there.

## 2022-08-06 NOTE — Telephone Encounter (Signed)
Patient is requesting a refill of the following medications: Requested Prescriptions   Pending Prescriptions Disp Refills   meloxicam (MOBIC) 7.5 MG tablet 30 tablet 0    Sig: TAKE 1 TABLET(7.5 MG) BY MOUTH DAILY AS NEEDED    Date of patient request: 08/06/22 Last office visit: 05/08/22 Date of last refill: 06/23/22 Last refill amount: 30 Follow up time period per chart: 5 months  Asking for 90 days at a time

## 2022-08-06 NOTE — Telephone Encounter (Signed)
I did refill for #90, but if she is requiring that medication daily or frequent use, we will need to look at other options due to risks of chronic use of that medication.

## 2022-10-17 ENCOUNTER — Encounter: Payer: BC Managed Care – PPO | Admitting: Family Medicine

## 2022-12-02 ENCOUNTER — Other Ambulatory Visit: Payer: Self-pay

## 2022-12-02 DIAGNOSIS — M25562 Pain in left knee: Secondary | ICD-10-CM

## 2022-12-02 MED ORDER — MELOXICAM 7.5 MG PO TABS: ORAL_TABLET | ORAL | 0 refills | Status: DC

## 2022-12-04 ENCOUNTER — Encounter: Payer: BC Managed Care – PPO | Admitting: Family Medicine

## 2022-12-10 ENCOUNTER — Encounter: Payer: Self-pay | Admitting: Family Medicine

## 2022-12-10 ENCOUNTER — Ambulatory Visit (INDEPENDENT_AMBULATORY_CARE_PROVIDER_SITE_OTHER): Payer: BC Managed Care – PPO | Admitting: Family Medicine

## 2022-12-10 VITALS — BP 138/78 | HR 55 | Temp 98.3°F | Ht 64.0 in | Wt 262.2 lb

## 2022-12-10 DIAGNOSIS — Z0000 Encounter for general adult medical examination without abnormal findings: Secondary | ICD-10-CM

## 2022-12-10 DIAGNOSIS — Z131 Encounter for screening for diabetes mellitus: Secondary | ICD-10-CM

## 2022-12-10 DIAGNOSIS — F418 Other specified anxiety disorders: Secondary | ICD-10-CM

## 2022-12-10 DIAGNOSIS — Z23 Encounter for immunization: Secondary | ICD-10-CM | POA: Diagnosis not present

## 2022-12-10 DIAGNOSIS — E039 Hypothyroidism, unspecified: Secondary | ICD-10-CM | POA: Diagnosis not present

## 2022-12-10 DIAGNOSIS — Z1159 Encounter for screening for other viral diseases: Secondary | ICD-10-CM

## 2022-12-10 DIAGNOSIS — N951 Menopausal and female climacteric states: Secondary | ICD-10-CM

## 2022-12-10 DIAGNOSIS — Z114 Encounter for screening for human immunodeficiency virus [HIV]: Secondary | ICD-10-CM

## 2022-12-10 DIAGNOSIS — Z800 Family history of malignant neoplasm of digestive organs: Secondary | ICD-10-CM | POA: Insufficient documentation

## 2022-12-10 DIAGNOSIS — M25562 Pain in left knee: Secondary | ICD-10-CM

## 2022-12-10 NOTE — Progress Notes (Signed)
Subjective:  Patient ID: Heidi Soto, female    DOB: 1971/02/13  Age: 51 y.o. MRN: [id number]  CC:  Chief Complaint  Patient presents with   Annual Exam    Pt states she is having a lot of hot flashes Pt is fasting     HPI Heidi Soto presents for Annual Exam  Followed by [facility] dermatology, Dr. Joana Reamer, oncology Dr. Leafy Ro,  for history of melanoma with longitudinal Melanonychia.  Last visit 12/03/2022.  Also discussed  treatment for seborrheic dermatitis, hand dermatitis and eczematous lesions on her trunk previously. 6 month follow ups.  Recent CT on 09/30/22: Impression:  1. Stable exam, no definite evidence of metastatic disease in the chest,  abdomen, or pelvis.  2.  Prominent left inguinal lymph nodes are not significantly changed  dating back to the 12/05/2019 exam.   Cardiology: Dr. Terrence Dupont, followed every few months for HTN, HLD. Recent visit last month. Started on Zetia for lipids. Stopped statin d/t wrist pain  Menopausal hot flushes Last discussed in May. Treated with gabapentin.  Status post hysterectomy.  Intermittent when discussed in May.  Gabapentin was helping sleep and hot flashes. Currently taking 145m QHS. Recently has noticed more hot flushes, more intense. 2nd dose past few days. No change yet. Rare daytime sx.  No med side effects.   Hypothyroidism: Lab Results  Component Value Date   TSH 5.11 03/26/2022  Taking medication daily.  Prior dosage increased by cardiology to 112 mcg daily. No new hot or cold intolerance. No new hair changes. Some dry skin. No new heart palpitations or new fatigue.some weight increase.  Wt Readings from Last 3 Encounters:  12/10/22 262 lb 3.2 oz (118.9 kg)  05/08/22 253 lb 12.8 oz (115.1 kg)  03/26/22 256 lb 12.8 oz (116.5 kg)   Situational anxiety, has treated with alprazolam as needed.  Primarily before imaging studies.  Claustrophobia. No recent need of alprazolam.       12/10/2022    2:54 PM 05/08/2022     2:31 PM 03/26/2022    2:26 PM 04/29/2021    3:46 PM 05/24/2019   10:07 AM  Depression screen PHQ 2/9  Decreased Interest 0 0 0 0 0  Down, Depressed, Hopeless 0 0 0 0 0  PHQ - 2 Score 0 0 0 0 0  Altered sleeping 0      Tired, decreased energy 0      Change in appetite 1      Feeling bad or failure about yourself  0      Trouble concentrating 0      Moving slowly or fidgety/restless 0      Suicidal thoughts 0      PHQ-9 Score 1        Health Maintenance  Topic Date Due   HIV Screening  Never done   Hepatitis C Screening  Never done   COVID-19 Vaccine (4 - 2023-24 season) 12/26/2022 (Originally 08/15/2022)   Zoster Vaccines- Shingrix (1 of 2) 03/11/2023 (Originally 09/26/1991)   INFLUENZA VACCINE  03/15/2023 (Originally 07/15/2022)   DTaP/Tdap/Td (2 - Td or Tdap) 07/17/2023   MAMMOGRAM  02/05/2024   COLONOSCOPY (Pts 45-410yrInsurance coverage will need to be confirmed)  12/11/2029   HPV VACCINES  Aged Out   PAP SMEAR-Modifier  Discontinued  Colonoscopy at age 51 due, Dr. MaCollene MaresFH and polyp.  Mammogram 01/2022.  GYN - [facility], s/p hysterectomy.  Followed by derm as above.  HIV and  Hep C - agrees to testing.   Immunization History  Administered Date(s) Administered   Influenza, Seasonal, Injecte, Preservative Fre 12/30/2012   Influenza,inj,Quad PF,6+ Mos 12/05/2019   PFIZER Comirnaty(Gray Top)Covid-19 Tri-Sucrose Vaccine 02/16/2020, 03/09/2020, 12/04/2020   Tdap 07/16/2013  Covid booster - unsure if she will have. Option at pharmacy discussed.  Shingrix - declines today Flu vaccine - today.  No results found. Due for optho eval - Dr. Idolina Primer, will call and schedule appt.   Dental: every 6 months.   Alcohol: 2-3 per week.   Tobacco: none.   Exercise:weight gain as above. Teaching 5 aerobic classes per week. Min weights, 1 hour cardio 5 times per week. Looking at diet changes, but considering meds - will schedule separate visit.    History Patient Active  Problem List   Diagnosis Date Noted   Family history of malignant neoplasm of digestive organs 12/10/2022   Melanoma ([place]) 07/29/2019   Obesity, unspecified 03/13/2014   S/P abdominal hysterectomy 06/30/2013   H/O Graves' disease 12/30/2012   Fibroids 12/30/2012   Past Medical History:  Diagnosis Date   Graves disease    Headache(784.0)    Hypertension    Thyroid disease    Past Surgical History:  Procedure Laterality Date   ABDOMINAL HYSTERECTOMY N/A 06/29/2013   Procedure: HYSTERECTOMY ABDOMINAL;  Surgeon: Elveria Royals, MD;  Location: [facility];  Service: Gynecology;  Laterality: N/A;   BILATERAL SALPINGECTOMY Bilateral 06/29/2013   Procedure: BILATERAL SALPINGECTOMY;  Surgeon: Elveria Royals, MD;  Location: [facility];  Service: Gynecology;  Laterality: Bilateral;   NO PAST SURGERIES     Allergies  Allergen Reactions   Bactroban [Mupirocin Calcium] Rash   Prior to Admission medications   Medication Sig Start Date End Date Taking? Authorizing Provider  ALPRAZolam Duanne Moron) 0.5 MG tablet Take 0.5-1 tablets (0.25-0.5 mg total) by mouth 2 (two) times daily as needed for anxiety (prior to procedure). 03/26/22  Yes Wendie Agreste, MD  amLODipine (NORVASC) 5 MG tablet Take 5 mg by mouth daily. 03/18/22  Yes [provider]  ezetimibe (ZETIA) 10 MG tablet Take 10 mg by mouth daily. 04/17/22  Yes [provider]  gabapentin (NEURONTIN) 100 MG capsule TAKE 1 TO 3 CAPSULES(100 TO 300 MG) BY MOUTH AT BEDTIME 06/25/22  Yes Wendie Agreste, MD  gabapentin (NEURONTIN) 100 MG capsule Take by mouth. 03/26/22  Yes [provider]  levothyroxine (SYNTHROID) 100 MCG tablet Take by mouth. 05/27/19  Yes [provider]  levothyroxine (SYNTHROID) 88 MCG tablet TAKE 1 TABLET DAILY BEFORE BREAKFAST Patient taking differently: 112 mcg. TAKE 1 TABLET DAILY BEFORE BREAKFAST 09/15/19  Yes Wendie Agreste, MD  losartan (COZAAR) 50 MG tablet Take 100 mg by mouth daily.   Yes  [provider]  meloxicam (MOBIC) 7.5 MG tablet TAKE 1 TABLET(7.5 MG) BY MOUTH DAILY AS NEEDED 12/02/22  Yes Wendie Agreste, MD  Multiple Vitamin (MULTIVITAMIN WITH MINERALS) TABS Take 1 tablet by mouth daily. Reported on 05/19/2016   Yes [provider]  ALPRAZolam Duanne Moron) 0.5 MG tablet Take by mouth.    [provider]  levothyroxine (SYNTHROID) 100 MCG tablet Levothyroxine    [provider]  levothyroxine (SYNTHROID) 112 MCG tablet Take 112 mcg by mouth daily.    [provider]  loratadine (CLARITIN) 10 MG tablet Take 10 mg by mouth daily. Patient not taking: Reported on 12/10/2022    [provider]  losartan (COZAAR) 25 MG tablet Losartan  [provider]   Social History   Socioeconomic History   Marital status: Single    Spouse name: Not on file   Number of children: Not on file   Years of education: Not on file   Highest education level: Not on file  Occupational History   Not on file  Tobacco Use   Smoking status: Never   Smokeless tobacco: Never  Substance and Sexual Activity   Alcohol use: No    Comment: "not often"   Drug use: No   Sexual activity: Yes    Partners: Male  Other Topics Concern   Not on file  Social History Narrative   Not on file   Social Determinants of Health   Financial Resource Strain: Not on file  Food Insecurity: Not on file  Transportation Needs: Not on file  Physical Activity: Not on file  Stress: Not on file  Social Connections: Not on file  Intimate Partner Violence: Not on file    Review of Systems .13 point review of systems per patient health survey noted.  Negative other than as indicated above or in HPI.    Objective:   Vitals:   12/10/22 1457  BP: 138/78  Pulse: (!) 55  Temp: 98.3 F (36.8 C)  SpO2: 98%  Weight: 262 lb 3.2 oz (118.9 kg)  Height: _0  (1.626 m)     Physical Exam Constitutional:      Appearance: She is well-developed.  HENT:      Head: Normocephalic and atraumatic.     Right Ear: External ear normal.     Left Ear: External ear normal.  Eyes:     Conjunctiva/sclera: Conjunctivae normal.     Pupils: Pupils are equal, round, and reactive to light.  Neck:     Thyroid: No thyromegaly.  Cardiovascular:     Rate and Rhythm: Normal rate and regular rhythm.     Heart sounds: Normal heart sounds. No murmur heard. Pulmonary:     Effort: Pulmonary effort is normal. No respiratory distress.     Breath sounds: Normal breath sounds. No wheezing.  Abdominal:     General: Bowel sounds are normal.     Palpations: Abdomen is soft.     Tenderness: There is no abdominal tenderness.  Musculoskeletal:        General: No tenderness. Normal range of motion.     Cervical back: Normal range of motion and neck supple.  Lymphadenopathy:     Cervical: No cervical adenopathy.  Skin:    General: Skin is warm and dry.     Findings: No rash.  Neurological:     Mental Status: She is alert and oriented to person, place, and time.  Psychiatric:        Behavior: Behavior normal.        Thought Content: Thought content normal.        Assessment & Plan:  Heidi Soto is a 51 y.o. female . Annual physical exam  - -anticipatory guidance as below in AVS, screening labs above. Health maintenance items as above in HPI discussed/recommended as applicable.   Situational anxiety  -Stable with rare need of benzodiazepine, no refill needed at this time.  Hypothyroidism, unspecified type - Plan: TSH  -Check TSH, medication adjustments accordingly.  If normal and continued difficulty with weight loss, plans follow-up to discuss treatment options.  Need for hepatitis C screening test - Plan: Hepatitis C antibody  Screening for HIV (human immunodeficiency virus) - Plan: HIV  Antibody (routine testing w rflx)  Screening for diabetes mellitus - Plan: Comprehensive metabolic panel, Hemoglobin A1c  Menopausal hot flushes  -Gabapentin  dosing discussed with option of adjustments.  Did recently increased dosing, will see if that has some improvement or may follow-up if not improving to discuss other treatment options.  Needs flu shot - Plan: Flu Vaccine QUAD 8moIM (Fluarix, Fluzone & Alfiuria Quad PF)   No orders of the defined types were placed in this encounter.  Patient Instructions  Ok to 2068mdose of gapapentin nightly, and then 3004mf not helping in a week. Let me know what dose is effective for refills, or follow up if not effective.   Call gastroenterology and eye specialist for appointments.   I will check some labs with the weight gain and thyroid meds. Let me know if there are questions   Preventive Care 40-68 74ars Old, Female Preventive care refers to lifestyle choices and visits with your health care provider that can promote health and wellness. Preventive care visits are also called wellness exams. What can I expect for my preventive care visit? Counseling Your health care provider may ask you questions about your: Medical history, including: Past medical problems. Family medical history. Pregnancy history. Current health, including: Menstrual cycle. Method of birth control. Emotional well-being. Home life and relationship well-being. Sexual activity and sexual health. Lifestyle, including: Alcohol, nicotine or tobacco, and drug use. Access to firearms. Diet, exercise, and sleep habits. Work and work envStatisticianunscreen use. Safety issues such as seatbelt and bike helmet use. Physical exam Your health care provider will check your: Height and weight. These may be used to calculate your BMI (body mass index). BMI is a measurement that tells if you are at a healthy weight. Waist circumference. This measures the distance around your waistline. This measurement also tells if you are at a healthy weight and may help predict your risk of certain diseases, such as type 2 diabetes and high  blood pressure. Heart rate and blood pressure. Body temperature. Skin for abnormal spots. What immunizations do I need?  Vaccines are usually given at various ages, according to a schedule. Your health care provider will recommend vaccines for you based on your age, medical history, and lifestyle or other factors, such as travel or where you work. What tests do I need? Screening Your health care provider may recommend screening tests for certain conditions. This may include: Lipid and cholesterol levels. Diabetes screening. This is done by checking your blood sugar (glucose) after you have not eaten for a while (fasting). Pelvic exam and Pap test. Hepatitis B test. Hepatitis C test. HIV (human immunodeficiency virus) test. STI (sexually transmitted infection) testing, if you are at risk. Lung cancer screening. Colorectal cancer screening. Mammogram. Talk with your health care provider about when you should start having regular mammograms. This may depend on whether you have a family history of breast cancer. BRCA-related cancer screening. This may be done if you have a family history of breast, ovarian, tubal, or peritoneal cancers. Bone density scan. This is done to screen for osteoporosis. Talk with your health care provider about your test results, treatment options, and if necessary, the need for more tests. Follow these instructions at home: Eating and drinking  Eat a diet that includes fresh fruits and vegetables, whole grains, lean protein, and low-fat dairy products. Take vitamin and mineral supplements as recommended by your health care provider. Do not drink alcohol if: Your health care provider tells you not  to drink. You are pregnant, may be pregnant, or are planning to become pregnant. If you drink alcohol: Limit how much you have to 0-1 drink a day. Know how much alcohol is in your drink. In the [place] one drink equals one 12 oz bottle of beer (355 mL), one 5 oz glass of  wine (148 mL), or one 1 oz glass of hard liquor (44 mL). Lifestyle Brush your teeth every morning and night with fluoride toothpaste. Floss one time each day. Exercise for at least 30 minutes 5 or more days each week. Do not use any products that contain nicotine or tobacco. These products include cigarettes, chewing tobacco, and vaping devices, such as e-cigarettes. If you need help quitting, ask your health care provider. Do not use drugs. If you are sexually active, practice safe sex. Use a condom or other form of protection to prevent STIs. If you do not wish to become pregnant, use a form of birth control. If you plan to become pregnant, see your health care provider for a prepregnancy visit. Take aspirin only as told by your health care provider. Make sure that you understand how much to take and what form to take. Work with your health care provider to find out whether it is safe and beneficial for you to take aspirin daily. Find healthy ways to manage stress, such as: Meditation, yoga, or listening to music. Journaling. Talking to a trusted person. Spending time with friends and family. Minimize exposure to UV radiation to reduce your risk of skin cancer. Safety Always wear your seat belt while driving or riding in a vehicle. Do not drive: If you have been drinking alcohol. Do not ride with someone who has been drinking. When you are tired or distracted. While texting. If you have been using any mind-altering substances or drugs. Wear a helmet and other protective equipment during sports activities. If you have firearms in your house, make sure you follow all gun safety procedures. Seek help if you have been physically or sexually abused. What's next? Visit your health care provider once a year for an annual wellness visit. Ask your health care provider how often you should have your eyes and teeth checked. Stay up to date on all vaccines. This information is not intended to  replace advice given to you by your health care provider. Make sure you discuss any questions you have with your health care provider. Document Revised: 05/29/2021 Document Reviewed: 05/29/2021 Elsevier Patient Education  [place],   Merri Ray, MD [place], [facility] 12/10/22 3:30 PM

## 2022-12-10 NOTE — Patient Instructions (Addendum)
Ok to 211m dose of gapapentin nightly, and then 3053mif not helping in a week. Let me know what dose is effective for refills, or follow up if not effective.   Call gastroenterology and eye specialist for appointments.   I will check some labs with the weight gain and thyroid meds. Let me know if there are questions   Preventive Care [id number] Old, Female Preventive care refers to lifestyle choices and visits with your health care provider that can promote health and wellness. Preventive care visits are also called wellness exams. What can I expect for my preventive care visit? Counseling Your health care provider may ask you questions about your: Medical history, including: Past medical problems. Family medical history. Pregnancy history. Current health, including: Menstrual cycle. Method of birth control. Emotional well-being. Home life and relationship well-being. Sexual activity and sexual health. Lifestyle, including: Alcohol, nicotine or tobacco, and drug use. Access to firearms. Diet, exercise, and sleep habits. Work and work enStatisticianSunscreen use. Safety issues such as seatbelt and bike helmet use. Physical exam Your health care provider will check your: Height and weight. These may be used to calculate your BMI (body mass index). BMI is a measurement that tells if you are at a healthy weight. Waist circumference. This measures the distance around your waistline. This measurement also tells if you are at a healthy weight and may help predict your risk of certain diseases, such as type 2 diabetes and high blood pressure. Heart rate and blood pressure. Body temperature. Skin for abnormal spots. What immunizations do I need?  Vaccines are usually given at various ages, according to a schedule. Your health care provider will recommend vaccines for you based on your age, medical history, and lifestyle or other factors, such as travel or where you work. What tests do  I need? Screening Your health care provider may recommend screening tests for certain conditions. This may include: Lipid and cholesterol levels. Diabetes screening. This is done by checking your blood sugar (glucose) after you have not eaten for a while (fasting). Pelvic exam and Pap test. Hepatitis B test. Hepatitis C test. HIV (human immunodeficiency virus) test. STI (sexually transmitted infection) testing, if you are at risk. Lung cancer screening. Colorectal cancer screening. Mammogram. Talk with your health care provider about when you should start having regular mammograms. This may depend on whether you have a family history of breast cancer. BRCA-related cancer screening. This may be done if you have a family history of breast, ovarian, tubal, or peritoneal cancers. Bone density scan. This is done to screen for osteoporosis. Talk with your health care provider about your test results, treatment options, and if necessary, the need for more tests. Follow these instructions at home: Eating and drinking  Eat a diet that includes fresh fruits and vegetables, whole grains, lean protein, and low-fat dairy products. Take vitamin and mineral supplements as recommended by your health care provider. Do not drink alcohol if: Your health care provider tells you not to drink. You are pregnant, may be pregnant, or are planning to become pregnant. If you drink alcohol: Limit how much you have to 0-1 drink a day. Know how much alcohol is in your drink. In the [place] one drink equals one 12 oz bottle of beer (355 mL), one 5 oz glass of wine (148 mL), or one 1 oz glass of hard liquor (44 mL). Lifestyle Brush your teeth every morning and night with fluoride toothpaste. Floss one time each day. Exercise for  at least 30 minutes 5 or more days each week. Do not use any products that contain nicotine or tobacco. These products include cigarettes, chewing tobacco, and vaping devices, such as  e-cigarettes. If you need help quitting, ask your health care provider. Do not use drugs. If you are sexually active, practice safe sex. Use a condom or other form of protection to prevent STIs. If you do not wish to become pregnant, use a form of birth control. If you plan to become pregnant, see your health care provider for a prepregnancy visit. Take aspirin only as told by your health care provider. Make sure that you understand how much to take and what form to take. Work with your health care provider to find out whether it is safe and beneficial for you to take aspirin daily. Find healthy ways to manage stress, such as: Meditation, yoga, or listening to music. Journaling. Talking to a trusted person. Spending time with friends and family. Minimize exposure to UV radiation to reduce your risk of skin cancer. Safety Always wear your seat belt while driving or riding in a vehicle. Do not drive: If you have been drinking alcohol. Do not ride with someone who has been drinking. When you are tired or distracted. While texting. If you have been using any mind-altering substances or drugs. Wear a helmet and other protective equipment during sports activities. If you have firearms in your house, make sure you follow all gun safety procedures. Seek help if you have been physically or sexually abused. What's next? Visit your health care provider once a year for an annual wellness visit. Ask your health care provider how often you should have your eyes and teeth checked. Stay up to date on all vaccines. This information is not intended to replace advice given to you by your health care provider. Make sure you discuss any questions you have with your health care provider. Document Revised: 05/29/2021 Document Reviewed: 05/29/2021 Elsevier Patient Education  [place].

## 2022-12-11 LAB — COMPREHENSIVE METABOLIC PANEL
ALT: 18 U/L (ref 0–35)
AST: 20 U/L (ref 0–37)
Albumin: 4.5 g/dL (ref 3.5–5.2)
Alkaline Phosphatase: 81 U/L (ref 39–117)
BUN: 11 mg/dL (ref 6–23)
CO2: 30 mEq/L (ref 19–32)
Calcium: 9.8 mg/dL (ref 8.4–10.5)
Chloride: 100 mEq/L (ref 96–112)
Creatinine, Ser: 0.77 mg/dL (ref 0.40–1.20)
GFR: 90.08 mL/min (ref >60.00)
Glucose, Bld: 88 mg/dL (ref 70–99)
Potassium: 4.4 mEq/L (ref 3.5–5.1)
Sodium: 139 mEq/L (ref 135–145)
Total Bilirubin: 0.6 mg/dL (ref 0.2–1.2)
Total Protein: 7.6 g/dL (ref 6.0–8.3)

## 2022-12-11 LAB — HIV ANTIBODY (ROUTINE TESTING W REFLEX): HIV 1&2 Ab, 4th Generation: NONREACTIVE

## 2022-12-11 LAB — HEPATITIS C ANTIBODY: Hepatitis C Ab: NONREACTIVE

## 2022-12-11 LAB — HEMOGLOBIN A1C: Hgb A1c MFr Bld: 6.3 % (ref 4.6–6.5)

## 2022-12-12 LAB — TSH: TSH: 2.09 u[IU]/mL (ref 0.35–5.50)

## 2023-01-16 ENCOUNTER — Other Ambulatory Visit: Payer: Self-pay | Admitting: Family Medicine

## 2023-01-16 DIAGNOSIS — Z1231 Encounter for screening mammogram for malignant neoplasm of breast: Secondary | ICD-10-CM

## 2023-01-25 ENCOUNTER — Encounter: Payer: Self-pay | Admitting: Family Medicine

## 2023-01-25 DIAGNOSIS — N951 Menopausal and female climacteric states: Secondary | ICD-10-CM

## 2023-01-26 MED ORDER — GABAPENTIN 300 MG PO CAPS: 300.0000 mg | ORAL_CAPSULE | Freq: Every day | ORAL | 1 refills | Status: DC

## 2023-01-26 NOTE — Telephone Encounter (Signed)
See note in last OV that dose changed but no mention of what dose changed to pt is requesting TID dosing   Patient is requesting a refill of the following medications: Requested Prescriptions   Pending Prescriptions Disp Refills   gabapentin (NEURONTIN) 100 MG capsule 270 capsule 1    Sig: Take 1 capsule (100 mg total) by mouth 3 (three) times daily.    Date of patient request: 01/26/23 Last office visit: 12/10/22 Date of last refill: 06/25/22 Last refill amount: 90x3 Follow up time period per chart: 6 months

## 2023-01-26 NOTE — Telephone Encounter (Signed)
Changed to 339m, filled.

## 2023-02-25 ENCOUNTER — Other Ambulatory Visit: Payer: Self-pay | Admitting: Family Medicine

## 2023-02-25 DIAGNOSIS — M25562 Pain in left knee: Secondary | ICD-10-CM

## 2023-02-25 NOTE — Telephone Encounter (Signed)
Patient is requesting a refill of the following medications: Requested Prescriptions   Pending Prescriptions Disp Refills   meloxicam (MOBIC) 7.5 MG tablet [Pharmacy Med Name: MELOXICAM 7.'5MG'$  TABLETS] 90 tablet 0    Sig: TAKE 1 TABLET(7.5 MG) BY MOUTH DAILY AS NEEDED    Date of patient request: 02/25/23 Last office visit: 12/02/22 Date of last refill: 12/02/22 Last refill amount: 90 Follow up time period per chart: 6 months

## 2023-02-25 NOTE — Telephone Encounter (Signed)
We have not discussed her meloxicam since earlier last year.  That should typically be a a short-term medication.  Please schedule a visit so we can review that medication, reason for use, and discuss plan on how long to continue.  Thanks.

## 2023-02-26 NOTE — Telephone Encounter (Signed)
Left pt a vm stating if she needs this medication she will need to be seen sooner than her scheduled apt in June 2024 w/ Dr Carlota Raspberry.  Spoke to [place] at [facility] and they canceled the meloxicam refill that was sent in this morning .

## 2023-02-27 ENCOUNTER — Other Ambulatory Visit: Payer: Self-pay

## 2023-02-27 NOTE — Telephone Encounter (Signed)
Patient called back stating that she don't need a refill for this medication. Patient also states that she stopped taking Meloxicam 7.5 mg  almost two weeks ago.

## 2023-02-27 NOTE — Telephone Encounter (Signed)
Noted we will remove from med list

## 2023-03-09 ENCOUNTER — Ambulatory Visit
Admission: RE | Admit: 2023-03-09 | Discharge: 2023-03-09 | Disposition: A | Discharge status: Home / Self Care | Payer: BC Managed Care – PPO | Source: Ambulatory Visit | Attending: Family Medicine | Admitting: Family Medicine

## 2023-03-09 DIAGNOSIS — Z1231 Encounter for screening mammogram for malignant neoplasm of breast: Secondary | ICD-10-CM

## 2023-04-15 ENCOUNTER — Encounter: Payer: Self-pay | Admitting: Family Medicine

## 2023-04-15 DIAGNOSIS — F418 Other specified anxiety disorders: Secondary | ICD-10-CM

## 2023-04-15 MED ORDER — ALPRAZOLAM 0.5 MG PO TABS: 0.2500 mg | ORAL_TABLET | Freq: Two times a day (BID) | ORAL | 0 refills | Status: DC | PRN

## 2023-04-27 NOTE — Telephone Encounter (Signed)
Hi, Mrs. Ivory, I will send this information over to Dr.Greene to see what he thinks.  Hope you have a great day. Let us know if we can help with anything else let us know.  Graycie Halley,CMA [employer]  [phone]   *My chart messages may take 48-72 hours to address once received. If you need anything sooner, please call our office at [phone]*

## 2023-06-11 ENCOUNTER — Encounter: Payer: Self-pay | Admitting: Family Medicine

## 2023-06-11 ENCOUNTER — Ambulatory Visit: Payer: BC Managed Care – PPO | Admitting: Family Medicine

## 2023-06-11 VITALS — BP 126/74 | HR 72 | Temp 97.9°F | Ht 64.0 in | Wt 232.2 lb

## 2023-06-11 DIAGNOSIS — R7303 Prediabetes: Secondary | ICD-10-CM | POA: Diagnosis not present

## 2023-06-11 DIAGNOSIS — E039 Hypothyroidism, unspecified: Secondary | ICD-10-CM | POA: Diagnosis not present

## 2023-06-11 DIAGNOSIS — I10 Essential (primary) hypertension: Secondary | ICD-10-CM

## 2023-06-11 DIAGNOSIS — N951 Menopausal and female climacteric states: Secondary | ICD-10-CM | POA: Diagnosis not present

## 2023-06-11 DIAGNOSIS — E785 Hyperlipidemia, unspecified: Secondary | ICD-10-CM | POA: Diagnosis not present

## 2023-06-11 LAB — COMPREHENSIVE METABOLIC PANEL
ALT: 15 U/L (ref 0–35)
AST: 21 U/L (ref 0–37)
Albumin: 4.4 g/dL (ref 3.5–5.2)
Alkaline Phosphatase: 85 U/L (ref 39–117)
BUN: 14 mg/dL (ref 6–23)
CO2: 29 mEq/L (ref 19–32)
Calcium: 10.1 mg/dL (ref 8.4–10.5)
Chloride: 102 mEq/L (ref 96–112)
Creatinine, Ser: 0.7 mg/dL (ref 0.40–1.20)
GFR: 100.64 mL/min (ref >60.00)
Glucose, Bld: 79 mg/dL (ref 70–99)
Potassium: 4 mEq/L (ref 3.5–5.1)
Sodium: 138 mEq/L (ref 135–145)
Total Bilirubin: 0.7 mg/dL (ref 0.2–1.2)
Total Protein: 7.2 g/dL (ref 6.0–8.3)

## 2023-06-11 LAB — HEMOGLOBIN A1C: Hgb A1c MFr Bld: 5.9 % (ref 4.6–6.5)

## 2023-06-11 LAB — LIPID PANEL
Cholesterol: 175 mg/dL (ref 0–200)
HDL: 59.8 mg/dL (ref >39.00)
LDL Cholesterol: 101 mg/dL — ABNORMAL HIGH (ref 0–99)
NonHDL: 115.58
Total CHOL/HDL Ratio: 3
Triglycerides: 71 mg/dL (ref 0.0–149.0)
VLDL: 14.2 mg/dL (ref 0.0–40.0)

## 2023-06-11 LAB — TSH: TSH: 1.29 u[IU]/mL (ref 0.35–5.50)

## 2023-06-11 MED ORDER — GABAPENTIN 300 MG PO CAPS: 300.0000 mg | ORAL_CAPSULE | Freq: Two times a day (BID) | ORAL | 1 refills | Status: DC

## 2023-06-11 NOTE — Progress Notes (Signed)
Subjective:  Patient ID: KYANNAH CLIMER, female    DOB: 11-24-1971  Age: 52 y.o. MRN: [id number]  CC:  Chief Complaint  Patient presents with   Medical Management of Chronic Issues    Pt here for 6 month check in, has been having some hot flashes wonders if there is something she can take in addition to the gabapentin for this does not want to go to hormonal, pt has lost 30lbs since December FYI, notes not taking diclofenac tablet  anymore has switched to topical wants to ensure she is not taking too much with that topical     HPI CARLEE VONDERHAAR presents for   Menopausal symptoms Is taking gabapentin for hot flushes, having some breakthrough symptoms.  300 mg dose ordered in February.  Currently taking once at night. Still having some at night, more during day. would like to defer any hormonal treatments. Hot flushes during day. ? Certain foods trigger.  No side effects with.   Hypertension: Followed by cardiology as well, D.r Harwani. Last visit in May no labs at that time. Referred to prior labs, Amlodipine 5 mg daily, losartan 50 mg daily.  BP Readings from Last 3 Encounters:  06/11/23 126/74  12/10/22 138/78  05/08/22 122/66   Lab Results  Component Value Date   CREATININE 0.77 12/10/2022   Prediabetes: Elevated A1c in December.  Has been very intentional at improving diet, exercise and weight loss, and has lost 30 pounds since December visit.  Stopped eating meat in January, plant based measls since February.  Topical diclofenac for episodic arthralgias, switch from oral NSAID. Joint pains improved with weight loss. Using 2-3 times per day. Alleve once per day- advised to pick one or other. PT helped hip and back.  Lab Results  Component Value Date   HGBA1C 6.3 12/10/2022   Wt Readings from Last 3 Encounters:  06/11/23 232 lb 3.2 oz (105.3 kg)  12/10/22 262 lb 3.2 oz (118.9 kg)  05/08/22 253 lb 12.8 oz (115.1 kg)    Hyperlipidemia: Intolerant to statin previously,  switched to Zetia, followed by cardiology. Doing well. Fasting today.  Lab Results  Component Value Date   CHOL 187 11/03/2018   HDL 62 11/03/2018   LDLCALC 113 (H) 11/03/2018   TRIG 62 11/03/2018   CHOLHDL 3.0 11/03/2018   Lab Results  Component Value Date   ALT 18 12/10/2022   AST 20 12/10/2022   ALKPHOS 81 12/10/2022   BILITOT 0.6 12/10/2022      Hypothyroidism: Lab Results  Component Value Date   TSH 2.09 12/10/2022  Taking medication daily.  Synthroid 112 mcg daily. No new hot or cold intolerance. No new hair or skin changes, heart palpitations or new fatigue. Intentional weight loss as above, hot flushes.   Situational anxiety Prior to imaging studies typically, claustrophobia with use of alprazolam previously. Only with scans.    History Patient Active Problem List   Diagnosis Date Noted   Family history of malignant neoplasm of digestive organs 12/10/2022   Melanoma (HCC) 07/29/2019   Obesity, unspecified 03/13/2014   S/P abdominal hysterectomy 06/30/2013   H/O Graves' disease 12/30/2012   Fibroids 12/30/2012   Past Medical History:  Diagnosis Date   Graves disease    Headache(784.0)    Hypertension    Thyroid disease    Past Surgical History:  Procedure Laterality Date   ABDOMINAL HYSTERECTOMY N/A 06/29/2013   Procedure: HYSTERECTOMY ABDOMINAL;  Surgeon: Robley Fries, MD;  Location: [facility]  ORS;  Service: Gynecology;  Laterality: N/A;   BILATERAL SALPINGECTOMY Bilateral 06/29/2013   Procedure: BILATERAL SALPINGECTOMY;  Surgeon: Robley Fries, MD;  Location: [facility] ORS;  Service: Gynecology;  Laterality: Bilateral;   NO PAST SURGERIES     Allergies  Allergen Reactions   Bactroban [Mupirocin Calcium] Rash   Prior to Admission medications   Medication Sig Start Date End Date Taking? Authorizing Provider  ALPRAZolam Prudy Feeler) 0.5 MG tablet Take 0.5-1 tablets (0.25-0.5 mg total) by mouth 2 (two) times daily as needed for anxiety (prior to procedure). 04/15/23   Yes Shade Flood, MD  amLODipine (NORVASC) 5 MG tablet Take 5 mg by mouth daily. 03/18/22  Yes [provider]  diclofenac Sodium (VOLTAREN) 1 % GEL Apply 2 g topically 4 (four) times daily. 01/21/23  Yes [provider]  ezetimibe (ZETIA) 10 MG tablet Take 10 mg by mouth daily. 04/17/22  Yes [provider]  gabapentin (NEURONTIN) 300 MG capsule Take 1 capsule (300 mg total) by mouth at bedtime. 01/26/23  Yes Shade Flood, MD  levothyroxine (SYNTHROID) 112 MCG tablet Take 112 mcg by mouth daily.   Yes [provider]  losartan (COZAAR) 50 MG tablet Take 100 mg by mouth daily.   Yes [provider]  Multiple Vitamin (MULTIVITAMIN WITH MINERALS) TABS Take 1 tablet by mouth daily. Reported on 05/19/2016   Yes [provider]   Social History   Socioeconomic History   Marital status: Single    Spouse name: Not on file   Number of children: Not on file   Years of education: Not on file   Highest education level: Not on file  Occupational History   Not on file  Tobacco Use   Smoking status: Never   Smokeless tobacco: Never  Substance and Sexual Activity   Alcohol use: No    Comment: "not often"   Drug use: No   Sexual activity: Yes    Partners: Male  Other Topics Concern   Not on file  Social History Narrative   Not on file   Social Determinants of Health   Financial Resource Strain: Not on file  Food Insecurity: Not on file  Transportation Needs: Not on file  Physical Activity: Not on file  Stress: Not on file  Social Connections: Not on file  Intimate Partner Violence: Not on file    Review of Systems  Constitutional:  Negative for fatigue and unexpected weight change.  Respiratory:  Negative for chest tightness and shortness of breath.   Cardiovascular:  Negative for chest pain, palpitations and leg swelling.  Gastrointestinal:  Negative for abdominal pain and blood in stool.  Neurological:  Negative for dizziness,  syncope, light-headedness and headaches.     Objective:   Vitals:   06/11/23 1135  BP: 126/74  Pulse: 72  Temp: 97.9 F (36.6 C)  TempSrc: Temporal  SpO2: 98%  Weight: 232 lb 3.2 oz (105.3 kg)  Height: 5\' 4"  (1.626 m)     Physical Exam Vitals reviewed.  Constitutional:      Appearance: Normal appearance. She is well-developed.  HENT:     Head: Normocephalic and atraumatic.  Eyes:     Conjunctiva/sclera: Conjunctivae normal.     Pupils: Pupils are equal, round, and reactive to light.  Neck:     Vascular: No carotid bruit.  Cardiovascular:     Rate and Rhythm: Normal rate and regular rhythm.     Heart sounds: Normal heart sounds.  Pulmonary:  Effort: Pulmonary effort is normal.     Breath sounds: Normal breath sounds.  Abdominal:     Palpations: Abdomen is soft. There is no pulsatile mass.     Tenderness: There is no abdominal tenderness.  Musculoskeletal:     Right lower leg: No edema.     Left lower leg: No edema.  Skin:    General: Skin is warm and dry.  Neurological:     Mental Status: She is alert and oriented to person, place, and time.  Psychiatric:        Mood and Affect: Mood normal.        Behavior: Behavior normal.        Assessment & Plan:  LIBBY GOEHRING is a 52 y.o. female . Menopausal hot flushes - Plan: gabapentin (NEURONTIN) 300 MG capsule  -Tolerating gabapentin 300 mg dose at night, will try dosing in a.m. as well with potential side effects discussed.  If intolerant can look at other options or lower gabapentin dose in the morning.  RTC precautions.  Hypothyroidism, unspecified type - Plan: TSH  -No change in meds for now, check TSH.  Weight loss was intentional as above.  Hot flashes perimenopausal symptoms likely.  Essential hypertension - Plan: Comprehensive metabolic panel  -Stable, check updated labs, no med changes for now, continue follow-up with cardiology as planned  Hyperlipidemia, unspecified hyperlipidemia type - Plan:  Comprehensive metabolic panel, Lipid panel  -Tolerating Zetia, check updated labs, continue follow-up with cardiology as planned.  Prediabetes - Plan: Hemoglobin A1c  -Impressed with weight loss and commended on her efforts and changes.  Check A1c, anticipate this will be normal or significantly improved.  Meds ordered this encounter  Medications   gabapentin (NEURONTIN) 300 MG capsule    Sig: Take 1 capsule (300 mg total) by mouth 2 (two) times daily.    Dispense:  180 capsule    Refill:  1    ZERO refills remain on this prescription. Your patient is requesting advance approval of refills for this medication to PREVENT ANY MISSED DOSES   Patient Instructions  Randie Heinz work on the weight loss.  Keep up the good work!  I will check labs and let you know if any concerns, but I expect them to look better.  Try increasing the gabapentin to twice per day for hot flushes, but if any side effects let me know and we can look at other options.   Take care!    Signed,   Meredith Staggers, MD [facility], [facility] 06/11/23 12:55 PM

## 2023-06-11 NOTE — Patient Instructions (Addendum)
Great work on the weight loss.  Keep up the good work!  I will check labs and let you know if any concerns, but I expect them to look better.  Try increasing the gabapentin to twice per day for hot flushes, but if any side effects let me know and we can look at other options.   Take care!

## 2023-12-17 ENCOUNTER — Encounter: Payer: Self-pay | Admitting: Family Medicine

## 2023-12-17 ENCOUNTER — Ambulatory Visit: Payer: Self-pay | Admitting: Family Medicine

## 2023-12-17 VITALS — BP 130/70 | HR 53 | Temp 98.1°F | Ht 64.25 in | Wt 251.4 lb

## 2023-12-17 DIAGNOSIS — E039 Hypothyroidism, unspecified: Secondary | ICD-10-CM | POA: Diagnosis not present

## 2023-12-17 DIAGNOSIS — N951 Menopausal and female climacteric states: Secondary | ICD-10-CM | POA: Diagnosis not present

## 2023-12-17 DIAGNOSIS — I10 Essential (primary) hypertension: Secondary | ICD-10-CM | POA: Diagnosis not present

## 2023-12-17 DIAGNOSIS — Z0000 Encounter for general adult medical examination without abnormal findings: Secondary | ICD-10-CM | POA: Diagnosis not present

## 2023-12-17 DIAGNOSIS — Z23 Encounter for immunization: Secondary | ICD-10-CM

## 2023-12-17 DIAGNOSIS — F418 Other specified anxiety disorders: Secondary | ICD-10-CM | POA: Diagnosis not present

## 2023-12-17 DIAGNOSIS — E785 Hyperlipidemia, unspecified: Secondary | ICD-10-CM

## 2023-12-17 DIAGNOSIS — R7303 Prediabetes: Secondary | ICD-10-CM | POA: Diagnosis not present

## 2023-12-17 LAB — COMPREHENSIVE METABOLIC PANEL
ALT: 21 U/L (ref 0–35)
AST: 26 U/L (ref 0–37)
Albumin: 4.4 g/dL (ref 3.5–5.2)
Alkaline Phosphatase: 134 U/L — ABNORMAL HIGH (ref 39–117)
BUN: 12 mg/dL (ref 6–23)
CO2: 29 meq/L (ref 19–32)
Calcium: 9.5 mg/dL (ref 8.4–10.5)
Chloride: 100 meq/L (ref 96–112)
Creatinine, Ser: 0.69 mg/dL (ref 0.40–1.20)
GFR: 100.62 mL/min (ref >60.00)
Glucose, Bld: 89 mg/dL (ref 70–99)
Potassium: 4.1 meq/L (ref 3.5–5.1)
Sodium: 138 meq/L (ref 135–145)
Total Bilirubin: 0.5 mg/dL (ref 0.2–1.2)
Total Protein: 7 g/dL (ref 6.0–8.3)

## 2023-12-17 LAB — CBC WITH DIFFERENTIAL/PLATELET
Basophils Absolute: 0 10*3/uL (ref 0.0–0.1)
Basophils Relative: 0.5 % (ref 0.0–3.0)
Eosinophils Absolute: 0.1 10*3/uL (ref 0.0–0.7)
Eosinophils Relative: 1.7 % (ref 0.0–5.0)
HCT: 41.9 % (ref 36.0–46.0)
Hemoglobin: 13.5 g/dL (ref 12.0–15.0)
Lymphocytes Relative: 22.1 % (ref 12.0–46.0)
Lymphs Abs: 1.3 10*3/uL (ref 0.7–4.0)
MCHC: 32.1 g/dL (ref 30.0–36.0)
MCV: 87.4 fL (ref 78.0–100.0)
Monocytes Absolute: 0.5 10*3/uL (ref 0.1–1.0)
Monocytes Relative: 8.1 % (ref 3.0–12.0)
Neutro Abs: 4 10*3/uL (ref 1.4–7.7)
Neutrophils Relative %: 67.6 % (ref 43.0–77.0)
Platelets: 256 10*3/uL (ref 150.0–400.0)
RBC: 4.8 Mil/uL (ref 3.87–5.11)
RDW: 13.9 % (ref 11.5–15.5)
WBC: 6 10*3/uL (ref 4.0–10.5)

## 2023-12-17 LAB — LIPID PANEL
Cholesterol: 209 mg/dL — ABNORMAL HIGH (ref 0–200)
HDL: 75.8 mg/dL (ref >39.00)
LDL Cholesterol: 121 mg/dL — ABNORMAL HIGH (ref 0–99)
NonHDL: 133.52
Total CHOL/HDL Ratio: 3
Triglycerides: 65 mg/dL (ref 0.0–149.0)
VLDL: 13 mg/dL (ref 0.0–40.0)

## 2023-12-17 LAB — HEMOGLOBIN A1C: Hgb A1c MFr Bld: 6.1 % (ref 4.6–6.5)

## 2023-12-17 LAB — TSH: TSH: 6.29 u[IU]/mL — ABNORMAL HIGH (ref 0.35–5.50)

## 2023-12-17 MED ORDER — GABAPENTIN 300 MG PO CAPS: 300.0000 mg | ORAL_CAPSULE | Freq: Two times a day (BID) | ORAL | 1 refills | Status: DC

## 2023-12-17 MED ORDER — ALPRAZOLAM 0.5 MG PO TABS: 0.2500 mg | ORAL_TABLET | Freq: Two times a day (BID) | ORAL | 0 refills | Status: AC | PRN

## 2023-12-17 NOTE — Patient Instructions (Signed)
 Thank you for coming in today.  I am not changing any medications at this time.  Flu vaccine and tetanus vaccines updated today including the pertussis component to your tetanus vaccine.  First shingles vaccine given today, can be repeated anywhere from 2 to 6 months.  We can repeat that at your next visit.  If any concerns on labs I will let you know.    See information below about managing stress but if you are noticing more stress or anxiety symptoms, please follow-up and we can discuss that further including different treatment options.  I did refill the alprazolam  temporarily for your imaging procedure when that is due.  I also included the number for healthy weight and wellness weight management below.  We certainly can follow-up and discuss other treatment options if needed but they may be a good resource.  I think it would certainly be reasonable to meet with a nutritionist, let me know if you need a referral.  Make sure you are obtaining sufficient protein through your diet and iron-containing foods.  Again, if any concerns on labs I will let you know.  Take care!  [facility] Weight [facility]  [phone]  Stress, Adult Stress is a normal reaction to life events. Stress is what you feel when life demands more than you are used to, or more than you think you can handle. Some stress can be useful, such as studying for a test or meeting a deadline at work. Stress that occurs too often or for too long can cause problems. Long-lasting stress is called chronic stress. Chronic stress can affect your emotional health and interfere with relationships and normal daily activities. Too much stress can weaken your body's defense system (immune system) and increase your risk for physical illness. If you already have a medical problem, stress can make it worse. What are the causes? All sorts of life events can cause stress. An event that causes stress for one person may not be  stressful for someone else. Major life events, whether positive or negative, commonly cause stress. Examples include: Losing a job or starting a new job. Losing a loved one. Moving to a new town or home. Getting married or divorced. Having a baby. Getting injured or sick. Less obvious life events can also cause stress, especially if they occur day after day or in combination with each other. Examples include: Working long hours. Driving in traffic. Caring for children. Being in debt. Being in a difficult relationship. What are the signs or symptoms? Stress can cause emotional and physical symptoms and can lead to unhealthy behaviors. These include the following: Emotional symptoms Anxiety. This is feeling worried, afraid, on edge, overwhelmed, or out of control. Anger, including irritation or impatience. Depression. This is feeling sad, down, helpless, or guilty. Trouble focusing, remembering, or making decisions. Physical symptoms Aches and pains. These may affect your head, neck, back, stomach, or other areas of your body. Tight muscles or a clenched jaw. Low energy. Trouble sleeping. Unhealthy behaviors Eating to feel better (overeating) or skipping meals. Working too much or putting off tasks. Smoking, drinking alcohol, or using drugs to feel better. How is this diagnosed? A stress disorder is diagnosed through an assessment by your health care provider. A stress disorder may be diagnosed based on: Your symptoms and any stressful life events. Your medical history. Tests to rule out other causes of your symptoms. Depending on your condition, your health care provider may refer you to a  specialist for further evaluation. How is this treated?  Stress management techniques are the recommended treatment for stress. Medicine is not typically recommended for treating stress. Techniques to reduce your reaction to stressful life events include: Identifying stress. Monitor yourself  for symptoms of stress and notice what causes stress for you. These skills may help you to avoid or prepare for stressful events. Managing time. Set your priorities, keep a calendar of events, and learn to say no. These actions can help you avoid taking on too much. Techniques for dealing with stress include: Rethinking the problem. Try to think realistically about stressful events rather than ignoring them or overreacting. Try to find the positives in a stressful situation rather than focusing on the negatives. Exercise. Physical exercise can release both physical and emotional tension. The key is to find a form of exercise that you enjoy and do it regularly. Relaxation techniques. These relax the body and mind. Find one or more that you enjoy and use the techniques regularly. Examples include: Meditation, deep breathing, or progressive relaxation techniques. Yoga or tai chi. Biofeedback, mindfulness techniques, or journaling. Listening to music, being in nature, or taking part in other hobbies. Practicing a healthy lifestyle. Eat a balanced diet, drink plenty of water, limit or avoid caffeine, and get plenty of sleep. Having a strong support network. Spend time with family, friends, or other people you enjoy being around. Express your feelings and talk things over with someone you trust. Counseling or talk therapy with a mental health provider may help if you are having trouble managing stress by yourself. Follow these instructions at home: Lifestyle  Avoid drugs. Do not use any products that contain nicotine or tobacco. These products include cigarettes, chewing tobacco, and vaping devices, such as e-cigarettes. If you need help quitting, ask your health care provider. If you drink alcohol: Limit how much you have to: 0-1 drink a day for women who are not pregnant. 0-2 drinks a day for men. Know how much alcohol is in a drink. In the [place] one drink equals one 12 oz bottle of beer (355 mL),  one 5 oz glass of wine (148 mL), or one 1 oz glass of hard liquor (44 mL). Do not use alcohol or drugs to relax. Eat a balanced diet that includes fresh fruits and vegetables, whole grains, lean meats, fish, eggs, beans, and low-fat dairy. Avoid processed foods and foods high in added fat, sugar, and salt. Exercise at least 30 minutes on 5 or more days each week. Get 7-8 hours of sleep each night. General instructions  Practice stress management techniques as told by your health care provider. Drink enough fluid to keep your urine pale yellow. Take over-the-counter and prescription medicines only as told by your health care provider. Keep all follow-up visits. This is important. Contact a health care provider if: Your symptoms get worse. You have new symptoms. You feel overwhelmed by your problems and can no longer manage them by yourself. Get help right away if: You have thoughts of hurting yourself or others. Get help right awayif you feel like you may hurt yourself or others, or have thoughts about taking your own life. Go to your nearest emergency room or: Call 911. Call the National Suicide Prevention Lifeline at (773)501-8696 or 988. This is open 24 hours a day. Text the Crisis Text Line at (279)170-6576. Summary Stress is a normal reaction to life events. It can cause problems if it happens too often or for too long. Practicing stress  management techniques is the best way to treat stress. Counseling or talk therapy with a mental health provider may help if you are having trouble managing stress by yourself. This information is not intended to replace advice given to you by your health care provider. Make sure you discuss any questions you have with your health care provider. Document Revised: 07/11/2021 Document Reviewed: 07/11/2021 Elsevier Patient Education  2024 [employer].   Preventive Care 23-39 Years Old, Female Preventive care refers to lifestyle choices and visits with  your health care provider that can promote health and wellness. Preventive care visits are also called wellness exams. What can I expect for my preventive care visit? Counseling Your health care provider may ask you questions about your: Medical history, including: Past medical problems. Family medical history. Pregnancy history. Current health, including: Menstrual cycle. Method of birth control. Emotional well-being. Home life and relationship well-being. Sexual activity and sexual health. Lifestyle, including: Alcohol, nicotine or tobacco, and drug use. Access to firearms. Diet, exercise, and sleep habits. Work and work astronomer. Sunscreen use. Safety issues such as seatbelt and bike helmet use. Physical exam Your health care provider will check your: Height and weight. These may be used to calculate your BMI (body mass index). BMI is a measurement that tells if you are at a healthy weight. Waist circumference. This measures the distance around your waistline. This measurement also tells if you are at a healthy weight and may help predict your risk of certain diseases, such as type 2 diabetes and high blood pressure. Heart rate and blood pressure. Body temperature. Skin for abnormal spots. What immunizations do I need?  Vaccines are usually given at various ages, according to a schedule. Your health care provider will recommend vaccines for you based on your age, medical history, and lifestyle or other factors, such as travel or where you work. What tests do I need? Screening Your health care provider may recommend screening tests for certain conditions. This may include: Lipid and cholesterol levels. Diabetes screening. This is done by checking your blood sugar (glucose) after you have not eaten for a while (fasting). Pelvic exam and Pap test. Hepatitis B test. Hepatitis C test. HIV (human immunodeficiency virus) test. STI (sexually transmitted infection) testing, if  you are at risk. Lung cancer screening. Colorectal cancer screening. Mammogram. Talk with your health care provider about when you should start having regular mammograms. This may depend on whether you have a family history of breast cancer. BRCA-related cancer screening. This may be done if you have a family history of breast, ovarian, tubal, or peritoneal cancers. Bone density scan. This is done to screen for osteoporosis. Talk with your health care provider about your test results, treatment options, and if necessary, the need for more tests. Follow these instructions at home: Eating and drinking  Eat a diet that includes fresh fruits and vegetables, whole grains, lean protein, and low-fat dairy products. Take vitamin and mineral supplements as recommended by your health care provider. Do not drink alcohol if: Your health care provider tells you not to drink. You are pregnant, may be pregnant, or are planning to become pregnant. If you drink alcohol: Limit how much you have to 0-1 drink a day. Know how much alcohol is in your drink. In the [place] one drink equals one 12 oz bottle of beer (355 mL), one 5 oz glass of wine (148 mL), or one 1 oz glass of hard liquor (44 mL). Lifestyle Brush your teeth every morning and  night with fluoride toothpaste. Floss one time each day. Exercise for at least 30 minutes 5 or more days each week. Do not use any products that contain nicotine or tobacco. These products include cigarettes, chewing tobacco, and vaping devices, such as e-cigarettes. If you need help quitting, ask your health care provider. Do not use drugs. If you are sexually active, practice safe sex. Use a condom or other form of protection to prevent STIs. If you do not wish to become pregnant, use a form of birth control. If you plan to become pregnant, see your health care provider for a prepregnancy visit. Take aspirin only as told by your health care provider. Make sure that you  understand how much to take and what form to take. Work with your health care provider to find out whether it is safe and beneficial for you to take aspirin daily. Find healthy ways to manage stress, such as: Meditation, yoga, or listening to music. Journaling. Talking to a trusted person. Spending time with friends and family. Minimize exposure to UV radiation to reduce your risk of skin cancer. Safety Always wear your seat belt while driving or riding in a vehicle. Do not drive: If you have been drinking alcohol. Do not ride with someone who has been drinking. When you are tired or distracted. While texting. If you have been using any mind-altering substances or drugs. Wear a helmet and other protective equipment during sports activities. If you have firearms in your house, make sure you follow all gun safety procedures. Seek help if you have been physically or sexually abused. What's next? Visit your health care provider once a year for an annual wellness visit. Ask your health care provider how often you should have your eyes and teeth checked. Stay up to date on all vaccines. This information is not intended to replace advice given to you by your health care provider. Make sure you discuss any questions you have with your health care provider. Document Revised: 05/29/2021 Document Reviewed: 05/29/2021 Elsevier Patient Education  2024 [employer].

## 2023-12-17 NOTE — Progress Notes (Signed)
 Subjective:  Patient ID: Heidi Soto, female    DOB: 08-15-1971  Age: 53 y.o. MRN: [id number]  CC:  Chief Complaint  Patient presents with   Annual Exam    Pt notes she has changed to being Vegan February last year and is curious about her labs otherwise patient is well, pt is fasting, patient left a urine sample in the case we needed to test this today, pt also agreed to Tdap Flu and Shingles if it is safe for her to have all three     HPI Heidi Soto presents for Annual Exam PCP, me Followed by [facility]  - Dr. Verdon surgical oncologist for malignant melanoma of left great toe.  Recent visit November 8, recommended 6 months recheck with CT chest abdomen pelvis, follow-up with dermatology Dr. Lawyer. Cardiology, Dr. Levern - recent visit.  Ortho/sports med - Dr. Irving  Menopausal symptoms Treated with gabapentin  for hot flashes.  Discussed in June.  300 mg nightly with some breakthrough symptoms at that time, had noted certain foods that would trigger, declined hormonal treatments. Increased gabapentin  to BID. Has been taking Estroven - improved hot flushes.   Hypertension: Followed by cardiology, treated with amlodipine  5 mg daily, losartan 50 mg daily. Home readings: BP Readings from Last 3 Encounters:  12/17/23 130/70  06/11/23 126/74  12/10/22 138/78   Lab Results  Component Value Date   CREATININE 0.70 06/11/2023   Hyperlipidemia: On statin previously, treated with Zetia 10 mg daily without any new side effects. Lab Results  Component Value Date   CHOL 175 06/11/2023   HDL 59.80 06/11/2023   LDLCALC 101 (H) 06/11/2023   TRIG 71.0 06/11/2023   CHOLHDL 3 06/11/2023   Lab Results  Component Value Date   ALT 15 06/11/2023   AST 21 06/11/2023   ALKPHOS 85 06/11/2023   BILITOT 0.7 06/11/2023    Hypothyroidism: Lab Results  Component Value Date   TSH 1.29 06/11/2023  Taking medication daily.  Synthroid  112 mcg. No new hot or cold  intolerance. No new skin changes, heart palpitations or new fatigue. No new weight changes. Some hair thinning - discussed with derm - has been using Rogaine past week. Rare tingling in lip at times - plans to follow up if persistent. No weakness.    Prediabetes: As above she has been on a vegan diet since February of last year and intentional at improving diet and working on weight.  Weight had been 262 last year, down to 232 in June, some increase since that time at 251 today. Some increased sugar today. Plant protein. Would consider meeting with nutritionist - she will let me know if referral needed.  Lab Results  Component Value Date   HGBA1C 5.9 06/11/2023   Wt Readings from Last 3 Encounters:  12/17/23 251 lb 6.4 oz (114 kg)  06/11/23 232 lb 3.2 oz (105.3 kg)  12/10/22 262 lb 3.2 oz (118.9 kg)   Episodic arthralgias Treated with topical diclofenac when discussed in June but symptoms had improved with weight loss.  Prior hip and back pain has improved with physical therapy. Had steroid injection to R hip 12/3. Some HA afterward - now resolved.  Has used voltaren or lidocaine  with some relief to back.   Situational anxiety Prior to imaging studies with use of alprazolam  as needed only.  Controlled substance database reviewed.  Alprazolam  No. 2 on 04/15/2023, gabapentin  No. 180 on 10/05/2023, 06/11/2023. Imaging procedure. Some intermittent stress with work  and elderly parent.     12/17/2023   10:50 AM 06/11/2023   11:34 AM 12/10/2022    2:54 PM 05/08/2022    2:31 PM 03/26/2022    2:26 PM  Depression screen PHQ 2/9  Decreased Interest 0 0 0 0 0  Down, Depressed, Hopeless 0 0 0 0 0  PHQ - 2 Score 0 0 0 0 0  Altered sleeping 0 0 0    Tired, decreased energy 0 0 0    Change in appetite 0 0 1    Feeling bad or failure about yourself  0 0 0    Trouble concentrating 0 0 0    Moving slowly or fidgety/restless 0 0 0    Suicidal thoughts 0 0 0    PHQ-9 Score 0 0 1      Health Maintenance   Topic Date Due   Zoster Vaccines- Shingrix  (1 of 2) Never done   INFLUENZA VACCINE  07/16/2023   DTaP/Tdap/Td (2 - Td or Tdap) 07/17/2023   COVID-19 Vaccine (4 - 2024-25 season) 08/16/2023   MAMMOGRAM  03/08/2025   Colonoscopy  12/11/2029   Hepatitis C Screening  Completed   HIV Screening  Completed   HPV VACCINES  Aged Out  Colonoscopy 12/12/2019, repeat 5 years. Mammogram 03/09/2023 Followed by GYN.  [facility].  Status post hysterectomy for benign reasons - fibroids.   Immunization History  Administered Date(s) Administered   Influenza, Seasonal, Injecte, Preservative Fre 12/30/2012   Influenza,inj,Quad PF,6+ Mos 12/05/2019, 12/10/2022   PFIZER Comirnaty(Gray Top)Covid-19 Tri-Sucrose Vaccine 02/16/2020, 03/09/2020, 12/04/2020   Tdap 07/16/2013  Tdap, flu, Shingrix  today.   No results found. Wears glasses. Appt in past year.  No recent eye changes.   Dental: every 6 months. Dr. Christiana  Alcohol: rare - less than once per month   Tobacco: none  Exercise: teaching cardio kick box, hip hop step aerobics, burn class. 5 days per week - to 1 hour. Body mass index is 42.82 kg/m.   History Patient Active Problem List   Diagnosis Date Noted   Essential hypertension 12/17/2023   Hypothyroidism 12/17/2023   Family history of malignant neoplasm of digestive organs 12/10/2022   Melanoma (HCC) 07/29/2019   Obesity, unspecified 03/13/2014   S/P abdominal hysterectomy 06/30/2013   H/O Graves' disease 12/30/2012   Fibroids 12/30/2012   Past Medical History:  Diagnosis Date   Graves disease    Headache(784.0)    Hypertension    Thyroid  disease    Past Surgical History:  Procedure Laterality Date   ABDOMINAL HYSTERECTOMY N/A 06/29/2013   Procedure: HYSTERECTOMY ABDOMINAL;  Surgeon: Robbi JONELLE Render, MD;  Location: [facility];  Service: Gynecology;  Laterality: N/A;   BILATERAL SALPINGECTOMY Bilateral 06/29/2013   Procedure: BILATERAL SALPINGECTOMY;  Surgeon:  Robbi JONELLE Render, MD;  Location: [facility];  Service: Gynecology;  Laterality: Bilateral;   NO PAST SURGERIES     Allergies  Allergen Reactions   Bactroban [Mupirocin Calcium] Rash   Prior to Admission medications   Medication Sig Start Date End Date Taking? Authorizing Provider  ALPRAZolam  (XANAX ) 0.5 MG tablet Take 0.5-1 tablets (0.25-0.5 mg total) by mouth 2 (two) times daily as needed for anxiety (prior to procedure). 04/15/23  Yes Levora Reyes JONELLE, MD  amLODipine  (NORVASC ) 5 MG tablet Take 5 mg by mouth daily. 03/18/22  Yes [provider]  diclofenac Sodium (VOLTAREN) 1 % GEL Apply 2 g topically 4 (four) times daily. 01/21/23  Yes [provider]  ezetimibe (ZETIA) 10 MG tablet Take 10 mg by mouth daily. 04/17/22  Yes [provider]  gabapentin  (NEURONTIN ) 300 MG capsule Take 1 capsule (300 mg total) by mouth 2 (two) times daily. 06/11/23  Yes Levora Reyes SAUNDERS, MD  levothyroxine  (SYNTHROID ) 112 MCG tablet Take 112 mcg by mouth daily.   Yes [provider]  losartan (COZAAR) 50 MG tablet Take 100 mg by mouth daily.   Yes [provider]  Multiple Vitamin (MULTIVITAMIN WITH MINERALS) TABS Take 1 tablet by mouth daily. Reported on 05/19/2016   Yes [provider]   Social History   Socioeconomic History   Marital status: Single    Spouse name: Not on file   Number of children: Not on file   Years of education: Not on file   Highest education level: Not on file  Occupational History   Not on file  Tobacco Use   Smoking status: Never   Smokeless tobacco: Never  Substance and Sexual Activity   Alcohol use: No    Comment: not often   Drug use: No   Sexual activity: Yes    Partners: Male  Other Topics Concern   Not on file  Social History Narrative   Not on file   Social Drivers of Health   Financial Resource Strain: Not on file  Food Insecurity: Not on file  Transportation Needs: Not on file  Physical Activity: Not on file   Stress: Not on file  Social Connections: Not on file  Intimate Partner Violence: Not on file    Review of Systems 13 point review of systems per patient health survey noted.  Negative other than as indicated above or in HPI.    Objective:   Vitals:   12/17/23 1048  BP: 130/70  Pulse: (!) 53  Temp: 98.1 F (36.7 C)  TempSrc: Temporal  SpO2: 100%  Weight: 251 lb 6.4 oz (114 kg)  Height: 5' 4.25 (1.632 m)     Physical Exam Vitals reviewed.  Constitutional:      Appearance: She is well-developed.  HENT:     Head: Normocephalic and atraumatic.     Right Ear: External ear normal.     Left Ear: External ear normal.  Eyes:     Conjunctiva/sclera: Conjunctivae normal.     Pupils: Pupils are equal, round, and reactive to light.  Neck:     Thyroid : No thyromegaly.  Cardiovascular:     Rate and Rhythm: Normal rate and regular rhythm.     Heart sounds: Normal heart sounds. No murmur heard. Pulmonary:     Effort: Pulmonary effort is normal. No respiratory distress.     Breath sounds: Normal breath sounds. No wheezing.  Abdominal:     General: Bowel sounds are normal.     Palpations: Abdomen is soft.     Tenderness: There is no abdominal tenderness.  Musculoskeletal:        General: No tenderness. Normal range of motion.     Cervical back: Normal range of motion and neck supple.  Lymphadenopathy:     Cervical: No cervical adenopathy.  Skin:    General: Skin is warm and dry.     Findings: No rash.  Neurological:     Mental Status: She is alert and oriented to person, place, and time.  Psychiatric:        Behavior: Behavior normal.        Thought Content: Thought content normal.        Assessment &  Plan:  Heidi Soto is a 53 y.o. female . Annual physical exam  - -anticipatory guidance as below in AVS, screening labs above. Health maintenance items as above in HPI discussed/recommended as applicable.   Menopausal hot flushes - Plan: gabapentin  (NEURONTIN )  300 MG capsule  -Improved with Estroven and gabapentin , continue same.  Situational anxiety - Plan: ALPRAZolam  (XANAX ) 0.5 MG tablet  -Primarily with imaging study previously, but increased dressers as above.  Handout given on stress but plans separate visit to discuss other treatment options if necessary if persistent stress/anxiety symptoms.  Essential hypertension - Plan: CBC with Differential/Platelet  -Stable, check labs, no med changes at this time.  Ongoing follow-up with cardiology.  Asymptomatic.  Hypothyroidism, unspecified type - Plan: TSH  -Tolerating current med regimen, check labs and adjust plan accordingly.  Thinning of hair as above that has been discussed with her dermatologist.  Recently started Rogaine.  Hyperlipidemia, unspecified hyperlipidemia type - Plan: Lipid panel  -Tolerating ezetimibe, continue same.  Check labs and adjust plan accordingly.  Prediabetes - Plan: Hemoglobin A1c, Comprehensive metabolic panel  -Check A1c, commended on dietary changes.  Has had some difficulty with weight loss with dietary changes and exercise as above.  There may be some food craving, opportunities for improvement with some of those cravings/portions.  Option to meet with nutritionist, and can place referral if needed.  Additionally provided number for healthy weight and wellness weight management specialist to discuss different medication options.  As far as food cravings could consider Wellbutrin but with stress, possible anxiety symptoms as  above, that can potentially worsen with Wellbutrin.  Would need to monitor closely.    Flu vaccine need - Plan: Flu vaccine trivalent PF, 6mos and older(Flulaval,Afluria,Fluarix,Fluzone)  Need for Tdap vaccination - Plan: Tdap vaccine greater than or equal to 7yo IM  Need for shingles vaccine - Plan: Varicella-zoster vaccine IM, repeat in 2 to 6 months. Potential side effects of vaccines above discussed.    Meds ordered this encounter   Medications   gabapentin  (NEURONTIN ) 300 MG capsule    Sig: Take 1 capsule (300 mg total) by mouth 2 (two) times daily.    Dispense:  180 capsule    Refill:  1   ALPRAZolam  (XANAX ) 0.5 MG tablet    Sig: Take 0.5-1 tablets (0.25-0.5 mg total) by mouth 2 (two) times daily as needed for anxiety (prior to procedure).    Dispense:  2 tablet    Refill:  0   Patient Instructions  Thank you for coming in today.  I am not changing any medications at this time.  Flu vaccine and tetanus vaccines updated today including the pertussis component to your tetanus vaccine.  First shingles vaccine given today, can be repeated anywhere from 2 to 6 months.  We can repeat that at your next visit.  If any concerns on labs I will let you know.    See information below about managing stress but if you are noticing more stress or anxiety symptoms, please follow-up and we can discuss that further including different treatment options.  I did refill the alprazolam  temporarily for your imaging procedure when that is due.  I also included the number for healthy weight and wellness weight management below.  We certainly can follow-up and discuss other treatment options if needed but they may be a good resource.  I think it would certainly be reasonable to meet with a nutritionist, let me know if you need a referral.  Make  sure you are obtaining sufficient protein through your diet and iron-containing foods.  Again, if any concerns on labs I will let you know.  Take care!  [facility]  [phone]  Stress, Adult Stress is a normal reaction to life events. Stress is what you feel when life demands more than you are used to, or more than you think you can handle. Some stress can be useful, such as studying for a test or meeting a deadline at work. Stress that occurs too often or for too long can cause problems. Long-lasting stress is called chronic stress. Chronic stress can  affect your emotional health and interfere with relationships and normal daily activities. Too much stress can weaken your body's defense system (immune system) and increase your risk for physical illness. If you already have a medical problem, stress can make it worse. What are the causes? All sorts of life events can cause stress. An event that causes stress for one person may not be stressful for someone else. Major life events, whether positive or negative, commonly cause stress. Examples include: Losing a job or starting a new job. Losing a loved one. Moving to a new town or home. Getting married or divorced. Having a baby. Getting injured or sick. Less obvious life events can also cause stress, especially if they occur day after day or in combination with each other. Examples include: Working long hours. Driving in traffic. Caring for children. Being in debt. Being in a difficult relationship. What are the signs or symptoms? Stress can cause emotional and physical symptoms and can lead to unhealthy behaviors. These include the following: Emotional symptoms Anxiety. This is feeling worried, afraid, on edge, overwhelmed, or out of control. Anger, including irritation or impatience. Depression. This is feeling sad, down, helpless, or guilty. Trouble focusing, remembering, or making decisions. Physical symptoms Aches and pains. These may affect your head, neck, back, stomach, or other areas of your body. Tight muscles or a clenched jaw. Low energy. Trouble sleeping. Unhealthy behaviors Eating to feel better (overeating) or skipping meals. Working too much or putting off tasks. Smoking, drinking alcohol, or using drugs to feel better. How is this diagnosed? A stress disorder is diagnosed through an assessment by your health care provider. A stress disorder may be diagnosed based on: Your symptoms and any stressful life events. Your medical history. Tests to rule out other causes  of your symptoms. Depending on your condition, your health care provider may refer you to a specialist for further evaluation. How is this treated?  Stress management techniques are the recommended treatment for stress. Medicine is not typically recommended for treating stress. Techniques to reduce your reaction to stressful life events include: Identifying stress. Monitor yourself for symptoms of stress and notice what causes stress for you. These skills may help you to avoid or prepare for stressful events. Managing time. Set your priorities, keep a calendar of events, and learn to say no. These actions can help you avoid taking on too much. Techniques for dealing with stress include: Rethinking the problem. Try to think realistically about stressful events rather than ignoring them or overreacting. Try to find the positives in a stressful situation rather than focusing on the negatives. Exercise. Physical exercise can release both physical and emotional tension. The key is to find a form of exercise that you enjoy and do it regularly. Relaxation techniques. These relax the body and mind. Find one or more that you enjoy and use  the techniques regularly. Examples include: Meditation, deep breathing, or progressive relaxation techniques. Yoga or tai chi. Biofeedback, mindfulness techniques, or journaling. Listening to music, being in nature, or taking part in other hobbies. Practicing a healthy lifestyle. Eat a balanced diet, drink plenty of water, limit or avoid caffeine, and get plenty of sleep. Having a strong support network. Spend time with family, friends, or other people you enjoy being around. Express your feelings and talk things over with someone you trust. Counseling or talk therapy with a mental health provider may help if you are having trouble managing stress by yourself. Follow these instructions at home: Lifestyle  Avoid drugs. Do not use any products that contain nicotine or  tobacco. These products include cigarettes, chewing tobacco, and vaping devices, such as e-cigarettes. If you need help quitting, ask your health care provider. If you drink alcohol: Limit how much you have to: 0-1 drink a day for women who are not pregnant. 0-2 drinks a day for men. Know how much alcohol is in a drink. In the [place] one drink equals one 12 oz bottle of beer (355 mL), one 5 oz glass of wine (148 mL), or one 1 oz glass of hard liquor (44 mL). Do not use alcohol or drugs to relax. Eat a balanced diet that includes fresh fruits and vegetables, whole grains, lean meats, fish, eggs, beans, and low-fat dairy. Avoid processed foods and foods high in added fat, sugar, and salt. Exercise at least 30 minutes on 5 or more days each week. Get 7-8 hours of sleep each night. General instructions  Practice stress management techniques as told by your health care provider. Drink enough fluid to keep your urine pale yellow. Take over-the-counter and prescription medicines only as told by your health care provider. Keep all follow-up visits. This is important. Contact a health care provider if: Your symptoms get worse. You have new symptoms. You feel overwhelmed by your problems and can no longer manage them by yourself. Get help right away if: You have thoughts of hurting yourself or others. Get help right awayif you feel like you may hurt yourself or others, or have thoughts about taking your own life. Go to your nearest emergency room or: Call 911. Call the National Suicide Prevention Lifeline at (352)561-1398 or 988. This is open 24 hours a day. Text the Crisis Text Line at 8657753537. Summary Stress is a normal reaction to life events. It can cause problems if it happens too often or for too long. Practicing stress management techniques is the best way to treat stress. Counseling or talk therapy with a mental health provider may help if you are having trouble managing stress by  yourself. This information is not intended to replace advice given to you by your health care provider. Make sure you discuss any questions you have with your health care provider. Document Revised: 07/11/2021 Document Reviewed: 07/11/2021 Elsevier Patient Education  2024 [employer].   Preventive Care 38-68 Years Old, Female Preventive care refers to lifestyle choices and visits with your health care provider that can promote health and wellness. Preventive care visits are also called wellness exams. What can I expect for my preventive care visit? Counseling Your health care provider may ask you questions about your: Medical history, including: Past medical problems. Family medical history. Pregnancy history. Current health, including: Menstrual cycle. Method of birth control. Emotional well-being. Home life and relationship well-being. Sexual activity and sexual health. Lifestyle, including: Alcohol, nicotine or tobacco, and drug use. Access  to firearms. Diet, exercise, and sleep habits. Work and work astronomer. Sunscreen use. Safety issues such as seatbelt and bike helmet use. Physical exam Your health care provider will check your: Height and weight. These may be used to calculate your BMI (body mass index). BMI is a measurement that tells if you are at a healthy weight. Waist circumference. This measures the distance around your waistline. This measurement also tells if you are at a healthy weight and may help predict your risk of certain diseases, such as type 2 diabetes and high blood pressure. Heart rate and blood pressure. Body temperature. Skin for abnormal spots. What immunizations do I need?  Vaccines are usually given at various ages, according to a schedule. Your health care provider will recommend vaccines for you based on your age, medical history, and lifestyle or other factors, such as travel or where you work. What tests do I need? Screening Your health  care provider may recommend screening tests for certain conditions. This may include: Lipid and cholesterol levels. Diabetes screening. This is done by checking your blood sugar (glucose) after you have not eaten for a while (fasting). Pelvic exam and Pap test. Hepatitis B test. Hepatitis C test. HIV (human immunodeficiency virus) test. STI (sexually transmitted infection) testing, if you are at risk. Lung cancer screening. Colorectal cancer screening. Mammogram. Talk with your health care provider about when you should start having regular mammograms. This may depend on whether you have a family history of breast cancer. BRCA-related cancer screening. This may be done if you have a family history of breast, ovarian, tubal, or peritoneal cancers. Bone density scan. This is done to screen for osteoporosis. Talk with your health care provider about your test results, treatment options, and if necessary, the need for more tests. Follow these instructions at home: Eating and drinking  Eat a diet that includes fresh fruits and vegetables, whole grains, lean protein, and low-fat dairy products. Take vitamin and mineral supplements as recommended by your health care provider. Do not drink alcohol if: Your health care provider tells you not to drink. You are pregnant, may be pregnant, or are planning to become pregnant. If you drink alcohol: Limit how much you have to 0-1 drink a day. Know how much alcohol is in your drink. In the [place] one drink equals one 12 oz bottle of beer (355 mL), one 5 oz glass of wine (148 mL), or one 1 oz glass of hard liquor (44 mL). Lifestyle Brush your teeth every morning and night with fluoride toothpaste. Floss one time each day. Exercise for at least 30 minutes 5 or more days each week. Do not use any products that contain nicotine or tobacco. These products include cigarettes, chewing tobacco, and vaping devices, such as e-cigarettes. If you need help quitting,  ask your health care provider. Do not use drugs. If you are sexually active, practice safe sex. Use a condom or other form of protection to prevent STIs. If you do not wish to become pregnant, use a form of birth control. If you plan to become pregnant, see your health care provider for a prepregnancy visit. Take aspirin only as told by your health care provider. Make sure that you understand how much to take and what form to take. Work with your health care provider to find out whether it is safe and beneficial for you to take aspirin daily. Find healthy ways to manage stress, such as: Meditation, yoga, or listening to music. Journaling. Talking to a  trusted person. Spending time with friends and family. Minimize exposure to UV radiation to reduce your risk of skin cancer. Safety Always wear your seat belt while driving or riding in a vehicle. Do not drive: If you have been drinking alcohol. Do not ride with someone who has been drinking. When you are tired or distracted. While texting. If you have been using any mind-altering substances or drugs. Wear a helmet and other protective equipment during sports activities. If you have firearms in your house, make sure you follow all gun safety procedures. Seek help if you have been physically or sexually abused. What's next? Visit your health care provider once a year for an annual wellness visit. Ask your health care provider how often you should have your eyes and teeth checked. Stay up to date on all vaccines. This information is not intended to replace advice given to you by your health care provider. Make sure you discuss any questions you have with your health care provider. Document Revised: 05/29/2021 Document Reviewed: 05/29/2021 Elsevier Patient Education  2024 Elsevier Inc.     Signed,   Reyes Pines, MD [facility], [facility] 12/17/23 11:56 AM

## 2023-12-22 ENCOUNTER — Telehealth: Payer: Self-pay

## 2023-12-22 DIAGNOSIS — R748 Abnormal levels of other serum enzymes: Secondary | ICD-10-CM

## 2023-12-22 DIAGNOSIS — Z1329 Encounter for screening for other suspected endocrine disorder: Secondary | ICD-10-CM

## 2023-12-22 NOTE — Telephone Encounter (Signed)
-----   Message from Shade Flood sent at 12/22/2023  2:06 PM EST ----- Results sent by MyChart, please schedule a lab only visit in 1 month for repeat liver function tests and a TSH for diagnosis of hypothyroidism and elevated alkaline phosphatase.

## 2023-12-22 NOTE — Telephone Encounter (Signed)
 Lm to call back to schedule appt   Labs already ordered for future

## 2024-03-23 ENCOUNTER — Other Ambulatory Visit: Payer: Self-pay | Admitting: Family Medicine

## 2024-03-23 DIAGNOSIS — Z0000 Encounter for general adult medical examination without abnormal findings: Secondary | ICD-10-CM

## 2024-03-28 ENCOUNTER — Ambulatory Visit
Admission: RE | Admit: 2024-03-28 | Discharge: 2024-03-28 | Disposition: A | Discharge status: Home / Self Care | Source: Ambulatory Visit | Attending: Family Medicine | Admitting: Family Medicine

## 2024-03-28 ENCOUNTER — Other Ambulatory Visit: Payer: Self-pay | Admitting: Family Medicine

## 2024-03-28 DIAGNOSIS — Z1231 Encounter for screening mammogram for malignant neoplasm of breast: Secondary | ICD-10-CM

## 2024-03-28 DIAGNOSIS — Z0000 Encounter for general adult medical examination without abnormal findings: Secondary | ICD-10-CM

## 2024-04-03 ENCOUNTER — Encounter: Payer: Self-pay | Admitting: Family Medicine

## 2024-05-30 ENCOUNTER — Encounter: Payer: Self-pay | Admitting: Family Medicine

## 2024-05-30 DIAGNOSIS — L609 Nail disorder, unspecified: Secondary | ICD-10-CM

## 2024-05-31 NOTE — Addendum Note (Signed)
 Addended by: Marcy Sookdeo R on: 05/31/2024 08:10 PM   Modules accepted: Orders

## 2024-05-31 NOTE — Telephone Encounter (Signed)
 Patient responded with photo of the effected area

## 2024-05-31 NOTE — Telephone Encounter (Signed)
 Image noted, referral placed.

## 2024-06-09 ENCOUNTER — Other Ambulatory Visit: Payer: Self-pay | Admitting: Family Medicine

## 2024-06-09 ENCOUNTER — Ambulatory Visit: Admitting: Podiatry

## 2024-06-09 ENCOUNTER — Encounter: Payer: Self-pay | Admitting: Podiatry

## 2024-06-09 DIAGNOSIS — E890 Postprocedural hypothyroidism: Secondary | ICD-10-CM | POA: Insufficient documentation

## 2024-06-09 DIAGNOSIS — Z800 Family history of malignant neoplasm of digestive organs: Secondary | ICD-10-CM | POA: Insufficient documentation

## 2024-06-09 DIAGNOSIS — E559 Vitamin D deficiency, unspecified: Secondary | ICD-10-CM | POA: Insufficient documentation

## 2024-06-09 DIAGNOSIS — L603 Nail dystrophy: Secondary | ICD-10-CM | POA: Diagnosis not present

## 2024-06-09 DIAGNOSIS — N951 Menopausal and female climacteric states: Secondary | ICD-10-CM

## 2024-06-10 NOTE — Progress Notes (Signed)
 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 female presents with the above complaint.   ROS: Denies fever chills nausea vomit muscle aches pains calf pain back pain chest pain shortness of breath.  Past Medical History:  Diagnosis Date   Graves disease    Headache(784.0)    Hypertension    Thyroid  disease    Past Surgical History:  Procedure Laterality Date   ABDOMINAL HYSTERECTOMY N/A 06/29/2013   Procedure: HYSTERECTOMY ABDOMINAL;  Surgeon: Robbi JONELLE Render, MD;  Location: [facility];  Service: Gynecology;  Laterality: N/A;   BILATERAL SALPINGECTOMY Bilateral 06/29/2013   Procedure: BILATERAL SALPINGECTOMY;  Surgeon: Robbi JONELLE Render, MD;  Location: [facility];  Service: Gynecology;  Laterality: Bilateral;   NO PAST SURGERIES      Current Outpatient Medications:    ALPRAZolam  (XANAX ) 0.5 MG tablet, Take 0.5-1 tablets (0.25-0.5 mg total) by mouth 2 (two) times daily as needed for anxiety (prior to procedure)., Disp: 2 tablet, Rfl: 0   amLODipine  (NORVASC ) 5 MG tablet, Take 5 mg by mouth daily., Disp: , Rfl:    diclofenac Sodium (VOLTAREN) 1 % GEL, Apply 2 g topically 4 (four) times daily., Disp: , Rfl:    ezetimibe (ZETIA) 10 MG tablet, Take 10 mg by mouth daily., Disp: , Rfl:    gabapentin  (NEURONTIN ) 300 MG capsule, TAKE 1 CAPSULE(300 MG) BY MOUTH TWICE DAILY, Disp: 180 capsule, Rfl: 1   levothyroxine  (SYNTHROID ) 112 MCG tablet, Take 112 mcg by mouth daily., Disp: , Rfl:    losartan (COZAAR) 50 MG tablet, Take 100 mg by mouth daily., Disp:  , Rfl:    Multiple Vitamin (MULTIVITAMIN WITH MINERALS) TABS, Take 1 tablet by mouth daily. Reported on 05/19/2016, Disp: , Rfl:   Allergies  Allergen Reactions   Bactroban [Mupirocin Calcium] Rash   Review of Systems Objective:  There were no vitals filed for this visit.  General: Well developed, nourished, in no acute distress, alert and oriented x3   Dermatological: Skin is warm, dry and supple bilateral. Nails x 9 are well maintained; remaining integument appears unremarkable at this time. There are no open sores, no preulcerative lesions, no rash or signs of infection present.  She has dark discoloration to toes #2 #3 of the left foot hallux left was amputated secondary to melanoma.  These toenails are thicker more dystrophic.  She does have a small freckle to the distal tuft third digit right foot.  Dermatology is watching it as well does not demonstrate any abnormality.  Evaluation of the leg ankle foot does not demonstrate any other pigmented lesions.  Vascular: Dorsalis Pedis artery and Posterior Tibial artery pedal pulses are 2/4 bilateral with immedate capillary fill time. Pedal hair growth present. No varicosities and no lower extremity edema present bilateral.   Neruologic: Grossly intact via light touch bilateral. Vibratory intact via tuning fork bilateral. Protective threshold with Semmes Wienstein monofilament intact to all pedal sites bilateral. Patellar and  Achilles deep tendon reflexes 2+ bilateral. No Babinski or clonus noted bilateral.   Musculoskeletal: No gross boney pedal deformities bilateral. No pain, crepitus, or limitation noted with foot and ankle range of motion bilateral. Muscular strength 5/5 in all groups tested bilateral.  Gait: Unassisted, Nonantalgic.    Radiographs:  None taken  Assessment & Plan:   Assessment: History of melanoma hallux left.  Nail dystrophy does not appear to be anything other than that toes #2 #3 of the left foot.  Small nevus tuft  of toe third digit right  Plan: Samples of the skin and nail were taken today for pathologic evaluation we will follow-up with her in 30 days     Heidi Soto T. [place], [place]

## 2024-06-16 ENCOUNTER — Ambulatory Visit: Payer: 59 | Admitting: Family Medicine

## 2024-06-16 VITALS — BP 134/78 | HR 52 | Temp 98.0°F | Resp 15 | Ht 64.25 in | Wt 250.4 lb

## 2024-06-16 DIAGNOSIS — E039 Hypothyroidism, unspecified: Secondary | ICD-10-CM | POA: Diagnosis not present

## 2024-06-16 DIAGNOSIS — E785 Hyperlipidemia, unspecified: Secondary | ICD-10-CM | POA: Diagnosis not present

## 2024-06-16 DIAGNOSIS — I10 Essential (primary) hypertension: Secondary | ICD-10-CM | POA: Diagnosis not present

## 2024-06-16 DIAGNOSIS — Z23 Encounter for immunization: Secondary | ICD-10-CM

## 2024-06-16 DIAGNOSIS — K9041 Non-celiac gluten sensitivity: Secondary | ICD-10-CM | POA: Insufficient documentation

## 2024-06-16 DIAGNOSIS — N951 Menopausal and female climacteric states: Secondary | ICD-10-CM

## 2024-06-16 DIAGNOSIS — F418 Other specified anxiety disorders: Secondary | ICD-10-CM

## 2024-06-16 DIAGNOSIS — R7303 Prediabetes: Secondary | ICD-10-CM | POA: Diagnosis not present

## 2024-06-16 LAB — COMPREHENSIVE METABOLIC PANEL WITH GFR
ALT: 20 U/L (ref 0–35)
AST: 24 U/L (ref 0–37)
Albumin: 4.4 g/dL (ref 3.5–5.2)
Alkaline Phosphatase: 91 U/L (ref 39–117)
BUN: 8 mg/dL (ref 6–23)
CO2: 32 meq/L (ref 19–32)
Calcium: 9.5 mg/dL (ref 8.4–10.5)
Chloride: 102 meq/L (ref 96–112)
Creatinine, Ser: 0.77 mg/dL (ref 0.40–1.20)
GFR: 89.13 mL/min (ref >60.00)
Glucose, Bld: 86 mg/dL (ref 70–99)
Potassium: 4 meq/L (ref 3.5–5.1)
Sodium: 140 meq/L (ref 135–145)
Total Bilirubin: 0.5 mg/dL (ref 0.2–1.2)
Total Protein: 7.1 g/dL (ref 6.0–8.3)

## 2024-06-16 LAB — LIPID PANEL
Cholesterol: 187 mg/dL (ref 0–200)
HDL: 55.4 mg/dL (ref >39.00)
LDL Cholesterol: 112 mg/dL — ABNORMAL HIGH (ref 0–99)
NonHDL: 131.33
Total CHOL/HDL Ratio: 3
Triglycerides: 96 mg/dL (ref 0.0–149.0)
VLDL: 19.2 mg/dL (ref 0.0–40.0)

## 2024-06-16 LAB — TSH: TSH: 4.09 u[IU]/mL (ref 0.35–5.50)

## 2024-06-16 LAB — HEMOGLOBIN A1C: Hgb A1c MFr Bld: 6.1 % (ref 4.6–6.5)

## 2024-06-16 NOTE — Patient Instructions (Signed)
 Second shingles vaccine given today.  I will check your thyroid  level and if still elevated we may adjust her medications to see if that might help with some of your other symptoms.  If you have persistent hot flashes and thyroid  levels are normal, we can discuss some other options, can schedule a virtual visit to do so but I would like to see your labs first.  No med changes for now.  Let me know if you have questions and take care.

## 2024-06-16 NOTE — Progress Notes (Signed)
 Subjective:  Patient ID: Heidi Soto, female    DOB: [birth year]  Age: 53 y.o. MRN: [id number]  CC:  Chief Complaint  Patient presents with   Medical Management of Chronic Issues    Pt is doing well notes just wants to follow up on labs from last visit     HPI Heidi Soto presents for   Hypertension: Amlodipine  5 mg daily, losartan 50 mg daily. No side effects. Reassuring eval at urgent care for chest symptoms - no recurrence.  Home readings: 120-144/70-80 range.  BP Readings from Last 3 Encounters:  06/16/24 134/78  12/17/23 130/70  06/11/23 126/74   Lab Results  Component Value Date   CREATININE 0.69 12/17/2023   Hyperlipidemia: Zetia 10 mg daily. No new side effects. Did not tolerate statins.  Lab Results  Component Value Date   CHOL 209 (H) 12/17/2023   HDL 75.80 12/17/2023   LDLCALC 121 (H) 12/17/2023   TRIG 65.0 12/17/2023   CHOLHDL 3 12/17/2023   Lab Results  Component Value Date   ALT 21 12/17/2023   AST 26 12/17/2023   ALKPHOS 134 (H) 12/17/2023   BILITOT 0.5 12/17/2023   Situational anxiety Prior to imaging studies, alprazolam  has been filled as needed -no other use.   Prediabetes: Had switched to a vegan diet since last year, weight had improved from 262 to 232 then some increased weight to her visit in January. Lab Results  Component Value Date   HGBA1C 6.1 12/17/2023   Wt Readings from Last 3 Encounters:  06/16/24 250 lb 6.4 oz (113.6 kg)  12/17/23 251 lb 6.4 oz (114 kg)  06/11/23 232 lb 3.2 oz (105.3 kg)    Hypothyroidism: Lab Results  Component Value Date   TSH 6.29 (H) 12/17/2023  Previously treated with Synthroid  112 mcg daily, some hair thinning had been discussed with dermatology, using Rogaine, slight elevated TSH in January.  Continues on Synthroid  112 mcg daily. Taking medication daily.  No new hot or cold intolerance. Some hot flushes. Saw derm today - plan for topical tx in addition to Rogaine.  heart palpitations or new  fatigue. No new weight changes.    Menopausal hot flushes Previously treated with gabapentin .  300 mg increased to twice daily dosing previously and Estroven was helping hot flashes at her physical in January. Had improved, then some recurrence at night.   HM: Unknown if hep B vaccine previously - she will check levels.   Shingles vaccine # 2 today. discussed as well as COVID booster. 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   Vitamin D deficiency 06/09/2024   Essential hypertension 12/17/2023   Hypothyroidism 12/17/2023   Family history of malignant neoplasm of digestive organs 12/10/2022   Melanoma (HCC) 07/29/2019   Obesity, unspecified 03/13/2014   S/P abdominal hysterectomy 06/30/2013   H/O Graves' disease 12/30/2012   Fibroids 12/30/2012   Past Medical History:  Diagnosis Date   Graves disease    Headache(784.0)    Hypertension    Thyroid  disease    Past Surgical History:  Procedure  Laterality Date   ABDOMINAL HYSTERECTOMY N/A 06/29/2013   Procedure: HYSTERECTOMY ABDOMINAL;  Surgeon: Robbi JONELLE Render, MD;  Location: [facility];  Service: Gynecology;  Laterality: N/A;   BILATERAL SALPINGECTOMY Bilateral 06/29/2013   Procedure: BILATERAL SALPINGECTOMY;  Surgeon: Robbi JONELLE Render, MD;  Location: [facility];  Service: Gynecology;  Laterality: Bilateral;   NO PAST SURGERIES     Allergies  Allergen Reactions   Bactroban [Mupirocin Calcium] Rash   Prior to  Admission medications   Medication Sig Start Date End Date Taking? Authorizing Provider  ALPRAZolam  (XANAX ) 0.5 MG tablet Take 0.5-1 tablets (0.25-0.5 mg total) by mouth 2 (two) times daily as needed for anxiety (prior to procedure). 12/17/23  Yes Levora Reyes JONELLE, MD  amLODipine  (NORVASC ) 5 MG tablet Take 5 mg by mouth daily. 03/18/22  Yes [provider]  diclofenac Sodium (VOLTAREN) 1 % GEL Apply 2 g topically 4 (four) times daily. 01/21/23  Yes [provider]  ezetimibe (ZETIA) 10 MG tablet Take 10 mg by mouth daily. 04/17/22  Yes [provider]  gabapentin  (NEURONTIN ) 300 MG capsule TAKE 1 CAPSULE(300 MG) BY MOUTH TWICE DAILY 06/09/24  Yes Levora Reyes JONELLE, MD  levothyroxine  (SYNTHROID ) 112 MCG tablet Take 112 mcg by mouth daily.   Yes [provider]  losartan (COZAAR) 50 MG tablet Take 100 mg by mouth daily.   Yes [provider]  Multiple Vitamin (MULTIVITAMIN WITH MINERALS) TABS Take 1 tablet by mouth daily. Reported on 05/19/2016   Yes [provider]   Social History   Socioeconomic History   Marital status: Single    Spouse name: Not on file   Number of children: Not on file   Years of education: Not on file   Highest education level: Not on file  Occupational History   Not on file  Tobacco Use   Smoking status: Never   Smokeless tobacco: Never  Substance and Sexual Activity   Alcohol use: No    Comment: not often   Drug use: No   Sexual activity: Yes    Partners: Male  Other Topics Concern   Not on file  Social History Narrative   Not on file   Social Drivers of Health   Financial Resource Strain: Not on file  Food Insecurity: Not on file  Transportation Needs: Not on file  Physical Activity: Not on file  Stress: Not on file  Social Connections: Not on file  Intimate Partner Violence: Not on file    Review of Systems  Constitutional:  Negative for fatigue and unexpected weight change.  Respiratory:  Negative  for chest tightness and shortness of breath.   Cardiovascular:  Negative for chest pain, palpitations and leg swelling.  Gastrointestinal:  Negative for abdominal pain and blood in stool.  Neurological:  Negative for dizziness, syncope, light-headedness and headaches.     Objective:   Vitals:   06/16/24 1305  BP: 134/78  Pulse: (!) 52  Resp: 15  Temp: 98 F (36.7 C)  TempSrc: Temporal  SpO2: 96%  Weight: 250 lb 6.4 oz (113.6 kg)  Height: 5' 4.25 (1.632 m)     Physical Exam Vitals reviewed.  Constitutional:      Appearance: Normal appearance. She is well-developed.  HENT:     Head: Normocephalic and atraumatic.  Eyes:     Conjunctiva/sclera: Conjunctivae normal.     Pupils: Pupils are equal, round, and reactive to light.  Neck:     Vascular: No carotid bruit.  Cardiovascular:  Rate and Rhythm: Normal rate and regular rhythm.     Heart sounds: Normal heart sounds.  Pulmonary:     Effort: Pulmonary effort is normal.     Breath sounds: Normal breath sounds.  Abdominal:     Palpations: Abdomen is soft. There is no pulsatile mass.     Tenderness: There is no abdominal tenderness.  Musculoskeletal:     Right lower leg: No edema.     Left lower leg: No edema.  Skin:    General: Skin is warm and dry.  Neurological:     Mental Status: She is alert and oriented to person, place, and time.  Psychiatric:        Mood and Affect: Mood normal.        Behavior: Behavior normal.        Assessment & Plan:  Heidi Soto is a 53 y.o. female . Hypothyroidism, unspecified type - Plan: TSH  - Mildly elevated TSH previously, repeat levels and then decide on medication changes.  Hyperlipidemia, unspecified hyperlipidemia type - Plan: Comprehensive metabolic panel with GFR, Lipid panel  - Check updated labs, diet/exercise approach along with prediabetes treatment, continue Zetia for now, may need to adjust regimen depending on lab results.  Prediabetes - Plan: Hemoglobin  A1c  - Continue watch diet/exercise as above, check A1c and adjust plan accordingly.  Essential hypertension  - Borderline elevated, RTC precautions given if elevated readings at home, continue same med regimen, work on diet and exercise as above.  Situational anxiety  - Stable, no recent meds needed.  Menopausal hot flushes  - Improved with Estroven and gabapentin  then some recurrence.  Check TSH as above, then possible virtual visit to review different treatment options. Need for shingles vaccine - Plan: Varicella-zoster vaccine IM   No orders of the defined types were placed in this encounter.  Patient Instructions  Second shingles vaccine given today.  I will check your thyroid  level and if still elevated we may adjust her medications to see if that might help with some of your other symptoms.  If you have persistent hot flashes and thyroid  levels are normal, we can discuss some other options, can schedule a virtual visit to do so but I would like to see your labs first.  No med changes for now.  Let me know if you have questions and take care.    Signed,   Reyes Pines, MD [facility], [facility] 06/16/24 1:31 PM

## 2024-06-17 ENCOUNTER — Encounter: Payer: Self-pay | Admitting: Family Medicine

## 2024-06-21 ENCOUNTER — Ambulatory Visit: Payer: Self-pay | Admitting: Family Medicine

## 2024-07-04 ENCOUNTER — Telehealth: Payer: Self-pay | Admitting: Podiatry

## 2024-07-04 NOTE — Telephone Encounter (Signed)
 Patient is scheduled for 07/05/2024 because staff told her in order to get results(test on 6/26) she needed an appointment. She wants clarification on this. Does she need to come in for results or can she be given results over the phone?

## 2024-07-05 ENCOUNTER — Ambulatory Visit (INDEPENDENT_AMBULATORY_CARE_PROVIDER_SITE_OTHER): Admitting: Podiatry

## 2024-07-05 ENCOUNTER — Encounter: Payer: Self-pay | Admitting: *Deleted

## 2024-07-05 DIAGNOSIS — L603 Nail dystrophy: Secondary | ICD-10-CM

## 2024-07-05 NOTE — Progress Notes (Signed)
 Patient presents to the office today for fungal culture results.   Dr. Verta reviewed her results and her culture was negative. I sent her a Mychart message this morning prior to her appointment to let her know and she could cancel the appointment.   I gave patient a copy of the report and apologized she didn't see the message prior to her coming in today.   She will call or message us  with future concerns.

## 2024-07-07 ENCOUNTER — Encounter: Payer: Self-pay | Admitting: Family Medicine

## 2024-07-07 NOTE — Telephone Encounter (Signed)
 Please see message below from patient about drinking a tea with medications

## 2024-07-25 ENCOUNTER — Ambulatory Visit: Payer: Self-pay

## 2024-07-25 ENCOUNTER — Ambulatory Visit: Payer: Self-pay | Admitting: Family Medicine

## 2024-07-25 NOTE — Telephone Encounter (Signed)
 FYI Only or Action Required?: FYI only for provider.  Patient was last seen in primary care on 06/16/2024 by Levora Reyes SAUNDERS, MD.  Called Nurse Triage reporting right hip pain.  Symptoms began several years ago.  Interventions attempted: Nothing.  Symptoms are: gradually worsening.  Triage Disposition: Go to ED Now (Notify PCP)  Patient/caregiver understands and will follow disposition?: Yes     Copied from CRM [id number]. Topic: Clinical - Red Word Triage >> Jul 25, 2024 12:54 PM Chiquita SQUIBB wrote: Red Word that prompted transfer to Nurse Triage: Patient is having pain in her right hip that she has seen the doctor before and it is now getting worse. Reason for Disposition  [1] SEVERE pain (e.g., excruciating, unable to do any normal activities) AND [2] fever  Answer Assessment - Initial Assessment Questions 1. LOCATION and RADIATION: Where is the pain located? Does the pain spread (shoot) anywhere else?     Right hip 2. QUALITY: What does the pain feel like?  (e.g., sharp, dull, aching, burning)     Declined to answer triage questions.  States wants an appointment  Protocols used: Hip Pain-A-AH

## 2024-07-25 NOTE — Telephone Encounter (Signed)
 See triage note from today. Duplicate encounter.

## 2024-07-25 NOTE — Telephone Encounter (Signed)
 FYI

## 2024-07-25 NOTE — Telephone Encounter (Signed)
 The call dropped and this RN made first attempt to contact patient.            Copied from CRM [id number]. Topic: Clinical - Red Word Triage >> Jul 25, 2024 12:54 PM Heidi Soto wrote: Red Word that prompted transfer to Nurse Triage: Patient is having pain in her right hip that she has seen the doctor before and it is now getting worse.

## 2024-12-06 ENCOUNTER — Other Ambulatory Visit: Payer: Self-pay

## 2024-12-06 DIAGNOSIS — N951 Menopausal and female climacteric states: Secondary | ICD-10-CM

## 2024-12-06 MED ORDER — GABAPENTIN 300 MG PO CAPS: 300.0000 mg | ORAL_CAPSULE | Freq: Two times a day (BID) | ORAL | 1 refills | Status: AC

## 2025-01-02 ENCOUNTER — Encounter: Admitting: Family Medicine
